# Patient Record
Sex: Male | Born: 1960 | Race: White | Hispanic: No | Marital: Married | Smoking: Former smoker
Health system: Southern US, Community
[De-identification: ages and names within clinical notes are randomized; demographics above are authoritative.]

## PROBLEM LIST (undated history)

## (undated) DIAGNOSIS — I2510 Atherosclerotic heart disease of native coronary artery without angina pectoris: Secondary | ICD-10-CM

## (undated) DIAGNOSIS — E7800 Pure hypercholesterolemia, unspecified: Secondary | ICD-10-CM

## (undated) DIAGNOSIS — Z8719 Personal history of other diseases of the digestive system: Secondary | ICD-10-CM

## (undated) DIAGNOSIS — K219 Gastro-esophageal reflux disease without esophagitis: Secondary | ICD-10-CM

## (undated) DIAGNOSIS — M4316 Spondylolisthesis, lumbar region: Secondary | ICD-10-CM

## (undated) DIAGNOSIS — R519 Headache, unspecified: Secondary | ICD-10-CM

## (undated) DIAGNOSIS — R51 Headache: Secondary | ICD-10-CM

## (undated) DIAGNOSIS — M1990 Unspecified osteoarthritis, unspecified site: Secondary | ICD-10-CM

## (undated) DIAGNOSIS — I10 Essential (primary) hypertension: Secondary | ICD-10-CM

## (undated) HISTORY — DX: Atherosclerotic heart disease of native coronary artery without angina pectoris: I25.10

## (undated) HISTORY — PX: BACK SURGERY: SHX140

## (undated) HISTORY — PX: TONSILLECTOMY: SUR1361

## (undated) HISTORY — PX: NECK SURGERY: SHX720

## (undated) HISTORY — PX: EYE SURGERY: SHX253

---

## 1986-05-08 HISTORY — PX: HEMORROIDECTOMY: SUR656

## 1996-05-08 HISTORY — PX: HERNIA REPAIR: SHX51

## 2010-03-02 ENCOUNTER — Encounter: Admission: RE | Admit: 2010-03-02 | Discharge: 2010-03-02 | Payer: Self-pay | Admitting: Neurosurgery

## 2015-08-15 ENCOUNTER — Encounter (HOSPITAL_COMMUNITY): Payer: Self-pay | Admitting: Family Medicine

## 2015-08-15 ENCOUNTER — Emergency Department (HOSPITAL_COMMUNITY)
Admission: EM | Admit: 2015-08-15 | Discharge: 2015-08-16 | Disposition: A | Discharge status: Home / Self Care | Payer: Managed Care, Other (non HMO) | Attending: Emergency Medicine | Admitting: Emergency Medicine

## 2015-08-15 DIAGNOSIS — F4323 Adjustment disorder with mixed anxiety and depressed mood: Secondary | ICD-10-CM | POA: Diagnosis not present

## 2015-08-15 DIAGNOSIS — G43109 Migraine with aura, not intractable, without status migrainosus: Secondary | ICD-10-CM

## 2015-08-15 DIAGNOSIS — Z79899 Other long term (current) drug therapy: Secondary | ICD-10-CM | POA: Diagnosis not present

## 2015-08-15 DIAGNOSIS — Z7951 Long term (current) use of inhaled steroids: Secondary | ICD-10-CM | POA: Insufficient documentation

## 2015-08-15 DIAGNOSIS — I10 Essential (primary) hypertension: Secondary | ICD-10-CM | POA: Insufficient documentation

## 2015-08-15 DIAGNOSIS — F1310 Sedative, hypnotic or anxiolytic abuse, uncomplicated: Secondary | ICD-10-CM | POA: Diagnosis not present

## 2015-08-15 DIAGNOSIS — G43809 Other migraine, not intractable, without status migrainosus: Secondary | ICD-10-CM | POA: Diagnosis not present

## 2015-08-15 DIAGNOSIS — H748X2 Other specified disorders of left middle ear and mastoid: Secondary | ICD-10-CM | POA: Insufficient documentation

## 2015-08-15 DIAGNOSIS — E7800 Pure hypercholesterolemia, unspecified: Secondary | ICD-10-CM | POA: Diagnosis not present

## 2015-08-15 DIAGNOSIS — F322 Major depressive disorder, single episode, severe without psychotic features: Secondary | ICD-10-CM | POA: Diagnosis present

## 2015-08-15 DIAGNOSIS — R45851 Suicidal ideations: Secondary | ICD-10-CM | POA: Diagnosis present

## 2015-08-15 DIAGNOSIS — F329 Major depressive disorder, single episode, unspecified: Secondary | ICD-10-CM | POA: Insufficient documentation

## 2015-08-15 DIAGNOSIS — F32A Depression, unspecified: Secondary | ICD-10-CM

## 2015-08-15 HISTORY — DX: Pure hypercholesterolemia, unspecified: E78.00

## 2015-08-15 HISTORY — DX: Essential (primary) hypertension: I10

## 2015-08-15 LAB — COMPREHENSIVE METABOLIC PANEL
ALBUMIN: 4.1 g/dL (ref 3.5–5.0)
ALK PHOS: 63 U/L (ref 38–126)
ALT: 28 U/L (ref 17–63)
ANION GAP: 11 (ref 5–15)
AST: 26 U/L (ref 15–41)
BILIRUBIN TOTAL: 0.9 mg/dL (ref 0.3–1.2)
BUN: 20 mg/dL (ref 6–20)
CALCIUM: 9.7 mg/dL (ref 8.9–10.3)
CO2: 22 mmol/L (ref 22–32)
Chloride: 111 mmol/L (ref 101–111)
Creatinine, Ser: 0.98 mg/dL (ref 0.61–1.24)
GLUCOSE: 113 mg/dL — AB (ref 65–99)
POTASSIUM: 4.2 mmol/L (ref 3.5–5.1)
Sodium: 144 mmol/L (ref 135–145)
TOTAL PROTEIN: 6.8 g/dL (ref 6.5–8.1)

## 2015-08-15 LAB — CBC
HCT: 51.5 % (ref 39.0–52.0)
Hemoglobin: 18.5 g/dL — ABNORMAL HIGH (ref 13.0–17.0)
MCH: 30.2 pg (ref 26.0–34.0)
MCHC: 35.9 g/dL (ref 30.0–36.0)
MCV: 84.2 fL (ref 78.0–100.0)
Platelets: 201 10*3/uL (ref 150–400)
RBC: 6.12 MIL/uL — ABNORMAL HIGH (ref 4.22–5.81)
RDW: 13.4 % (ref 11.5–15.5)
WBC: 8 10*3/uL (ref 4.0–10.5)

## 2015-08-15 LAB — RAPID URINE DRUG SCREEN, HOSP PERFORMED
AMPHETAMINES: NOT DETECTED
BENZODIAZEPINES: POSITIVE — AB
Barbiturates: NOT DETECTED
COCAINE: NOT DETECTED
OPIATES: NOT DETECTED
TETRAHYDROCANNABINOL: NOT DETECTED

## 2015-08-15 LAB — ETHANOL

## 2015-08-15 LAB — ACETAMINOPHEN LEVEL

## 2015-08-15 LAB — SALICYLATE LEVEL

## 2015-08-15 MED ORDER — ACETAMINOPHEN 325 MG PO TABS: 650.0000 mg | ORAL_TABLET | ORAL | Status: DC | PRN

## 2015-08-15 MED ORDER — KETOROLAC TROMETHAMINE 15 MG/ML IJ SOLN
15.0000 mg | Freq: Once | INTRAMUSCULAR | Status: AC
  Administered 2015-08-15: 15 mg via INTRAVENOUS
  Filled 2015-08-15: qty 1

## 2015-08-15 MED ORDER — LISINOPRIL-HYDROCHLOROTHIAZIDE 20-25 MG PO TABS: 1.0000 | ORAL_TABLET | Freq: Every day | ORAL | Status: DC

## 2015-08-15 MED ORDER — LISINOPRIL 20 MG PO TABS
20.0000 mg | ORAL_TABLET | Freq: Every day | ORAL | Status: DC
  Administered 2015-08-16: 20 mg via ORAL
  Filled 2015-08-15: qty 1

## 2015-08-15 MED ORDER — PSYLLIUM 95 % PO PACK: 1.0000 | PACK | Freq: Every day | ORAL | Status: DC

## 2015-08-15 MED ORDER — FLUTICASONE PROPIONATE 50 MCG/ACT NA SUSP
1.0000 | Freq: Every day | NASAL | Status: DC
  Administered 2015-08-16: 1 via NASAL
  Filled 2015-08-15: qty 16

## 2015-08-15 MED ORDER — DEXAMETHASONE SODIUM PHOSPHATE 10 MG/ML IJ SOLN
10.0000 mg | Freq: Once | INTRAMUSCULAR | Status: AC
  Administered 2015-08-15: 10 mg via INTRAVENOUS
  Filled 2015-08-15: qty 1

## 2015-08-15 MED ORDER — DIVALPROEX SODIUM 250 MG PO DR TAB
250.0000 mg | DELAYED_RELEASE_TABLET | Freq: Two times a day (BID) | ORAL | Status: DC
  Administered 2015-08-15 – 2015-08-16 (×2): 250 mg via ORAL
  Filled 2015-08-15 (×2): qty 1

## 2015-08-15 MED ORDER — IBUPROFEN 400 MG PO TABS
600.0000 mg | ORAL_TABLET | Freq: Three times a day (TID) | ORAL | Status: DC | PRN
  Administered 2015-08-16: 600 mg via ORAL
  Filled 2015-08-15: qty 1

## 2015-08-15 MED ORDER — PSYLLIUM 95 % PO PACK
1.0000 | PACK | Freq: Every day | ORAL | Status: DC
  Administered 2015-08-15: 1 via ORAL
  Filled 2015-08-15 (×2): qty 1

## 2015-08-15 MED ORDER — HYDROCHLOROTHIAZIDE 25 MG PO TABS
25.0000 mg | ORAL_TABLET | Freq: Every day | ORAL | Status: DC
  Administered 2015-08-16: 25 mg via ORAL
  Filled 2015-08-15: qty 1

## 2015-08-15 MED ORDER — PROMETHAZINE HCL 25 MG PO TABS: 12.5000 mg | ORAL_TABLET | Freq: Every day | ORAL | Status: DC | PRN

## 2015-08-15 MED ORDER — MECLIZINE HCL 25 MG PO TABS: 25.0000 mg | ORAL_TABLET | Freq: Three times a day (TID) | ORAL | Status: DC | PRN

## 2015-08-15 MED ORDER — ZOLPIDEM TARTRATE 5 MG PO TABS
5.0000 mg | ORAL_TABLET | Freq: Every day | ORAL | Status: DC
  Administered 2015-08-15: 5 mg via ORAL
  Filled 2015-08-15: qty 1

## 2015-08-15 MED ORDER — ATORVASTATIN CALCIUM 40 MG PO TABS
40.0000 mg | ORAL_TABLET | Freq: Every morning | ORAL | Status: DC
  Administered 2015-08-16: 40 mg via ORAL
  Filled 2015-08-15: qty 1

## 2015-08-15 MED ORDER — DIPHENHYDRAMINE HCL 50 MG/ML IJ SOLN
25.0000 mg | Freq: Once | INTRAMUSCULAR | Status: AC
  Administered 2015-08-15: 25 mg via INTRAVENOUS
  Filled 2015-08-15: qty 1

## 2015-08-15 MED ORDER — METOCLOPRAMIDE HCL 5 MG/ML IJ SOLN
10.0000 mg | Freq: Once | INTRAMUSCULAR | Status: AC
  Administered 2015-08-15: 10 mg via INTRAVENOUS
  Filled 2015-08-15: qty 2

## 2015-08-15 MED ORDER — LORATADINE 10 MG PO TABS
10.0000 mg | ORAL_TABLET | Freq: Every day | ORAL | Status: DC
  Administered 2015-08-16: 10 mg via ORAL
  Filled 2015-08-15: qty 1

## 2015-08-15 MED ORDER — PANTOPRAZOLE SODIUM 40 MG PO TBEC
40.0000 mg | DELAYED_RELEASE_TABLET | Freq: Every day | ORAL | Status: DC
  Administered 2015-08-15 – 2015-08-16 (×2): 40 mg via ORAL
  Filled 2015-08-15 (×2): qty 1

## 2015-08-15 NOTE — ED Notes (Signed)
Security to wand pt  

## 2015-08-15 NOTE — ED Notes (Signed)
Pt here for depression and suicidal thoughts. sts that he started Topamax 5 weeks ago for migraines and vertigo. sts never hx of depression before. sts he feels out of it. sts pain in ears and throat. sts last 2 days he ha shad thoughts and plans of what he would do to commit suicide.

## 2015-08-15 NOTE — ED Provider Notes (Addendum)
CSN: [id number]     Arrival date & time 08/15/15  1444 History   First MD Initiated Contact with Patient 08/15/15 1553     Chief Complaint  Patient presents with  . Depression  . Suicidal     (Consider location/radiation/quality/duration/timing/severity/associated sxs/prior Treatment) HPI Comments: Patient is a 55 year old male who has had ongoing issue since October with vestibular migraines. He has seen multiple specialists and had multiple imaging done and diagnosis with the last month has been a vestibular migraine. He has been taking meclizine, Valium with only moderate improvement. Now he has debilitating periods of nausea, dizziness and having difficulty functioning at work. Patient has seen a neurologist and they started him on Topamax recently to try to help with the migraines however he is not feeling better in 2 days ago he started to feel depressed with frequent episodes of crying and difficulty focusing. Then today he started to have suicidal thoughts of the possibility of shooting himself for putting a hose in his car but then states that it would be too much for his family. Then he thought about driving down the road in doing it to someone else may find him. When asked if he thinks he go through with it he said he didn't know when asked why he is here to get help.  Patient is a 55 y.o. male presenting with depression. The history is provided by the patient.  Depression This is a new problem. The current episode started 2 days ago. The problem occurs constantly. The problem has been gradually worsening. Associated symptoms include headaches. Nothing aggravates the symptoms. Nothing relieves the symptoms.    Past Medical History  Diagnosis Date  . High cholesterol   . Hypertension    Past Surgical History  Procedure Laterality Date  . Tonsillectomy    . Hernia repair     History reviewed. No pertinent family history. Social History  Substance Use Topics  . Smoking status:  Never Smoker   . Smokeless tobacco: None  . Alcohol Use: No    Review of Systems  Neurological: Positive for headaches.  Psychiatric/Behavioral: Positive for depression.  All other systems reviewed and are negative.     Allergies  Review of patient's allergies indicates no known allergies.  Home Medications   Prior to Admission medications   Medication Sig Start Date End Date Taking? Authorizing Provider  atorvastatin (LIPITOR) 40 MG tablet Take 40 mg by mouth every morning. 08/05/15  Yes Historical Provider, MD  cetirizine (ZYRTEC) 10 MG tablet Take 10 mg by mouth every morning.   Yes Historical Provider, MD  Diclofenac Sodium CR 100 MG 24 hr tablet Take 100 mg by mouth daily. 07/22/15  Yes Historical Provider, MD  fluticasone (FLONASE) 50 MCG/ACT nasal spray Place 1 spray into both nostrils daily. 05/19/15 05/18/16 Yes Historical Provider, MD  lisinopril-hydrochlorothiazide (PRINZIDE,ZESTORETIC) 20-25 MG tablet Take 1 tablet by mouth daily. 06/28/15  Yes Historical Provider, MD  meclizine (ANTIVERT) 25 MG tablet Take 25 mg by mouth 3 (three) times daily as needed for dizziness or nausea.  06/28/15  Yes Historical Provider, MD  omeprazole (PRILOSEC) 20 MG capsule Take 20 mg by mouth every morning.   Yes Historical Provider, MD  promethazine (PHENERGAN) 25 MG tablet Take 12.5 mg by mouth daily as needed for nausea or vomiting.  07/09/15  Yes Historical Provider, MD  psyllium (METAMUCIL) 58.6 % powder Take 1 packet by mouth every evening.   Yes Historical Provider, MD  topiramate (TOPAMAX) 25 MG  tablet Take 50 mg by mouth 2 (two) times daily. 07/22/15  Yes Historical Provider, MD  traMADol (ULTRAM) 50 MG tablet Take 25-50 mg by mouth See admin instructions. Takes 1 tab every morning and 1/2 tab in evening as needed for pain 07/29/15  Yes Historical Provider, MD  zolpidem (AMBIEN) 10 MG tablet Take 5 mg by mouth at bedtime. 08/05/15  Yes Historical Provider, MD   BP 123/87 mmHg  Pulse 69   Temp(Src) 98 F (36.7 C) (Oral)  Resp 18  Ht  (1.88 m)  Wt 250 lb (113.399 kg)  BMI 32.08 kg/m2  SpO2 97% Physical Exam  Constitutional: He is oriented to person, place, and time. He appears well-developed and well-nourished. No distress.  HENT:  Head: Normocephalic and atraumatic.  Right Ear: Tympanic membrane normal.  Left Ear: A middle ear effusion is present.  Mouth/Throat: Oropharynx is clear and moist.  Eyes: Conjunctivae and EOM are normal. Pupils are equal, round, and reactive to light.  Neck: Normal range of motion. Neck supple.  Cardiovascular: Normal rate, regular rhythm and intact distal pulses.   No murmur heard. Pulmonary/Chest: Effort normal and breath sounds normal. No respiratory distress. He has no wheezes. He has no rales.  Abdominal: Soft. He exhibits no distension. There is no tenderness. There is no rebound and no guarding.  Musculoskeletal: Normal range of motion. He exhibits no edema or tenderness.  Neurological: He is alert and oriented to person, place, and time.  Skin: Skin is warm and dry. No rash noted. No erythema.  Psychiatric: His behavior is normal. He exhibits a depressed mood. He expresses suicidal ideation.  Nursing note and vitals reviewed.   ED Course  Procedures (including critical care time) Labs Review Labs Reviewed  COMPREHENSIVE METABOLIC PANEL - Abnormal; Notable for the following:    Glucose, Bld 113 (*)    All other components within normal limits  ACETAMINOPHEN LEVEL - Abnormal; Notable for the following:    Acetaminophen (Tylenol), Serum <10 (*)    All other components within normal limits  CBC - Abnormal; Notable for the following:    RBC 6.12 (*)    Hemoglobin 18.5 (*)    All other components within normal limits  URINE RAPID DRUG SCREEN, HOSP PERFORMED - Abnormal; Notable for the following:    Benzodiazepines POSITIVE (*)    All other components within normal limits  ETHANOL  SALICYLATE LEVEL    Imaging Review No  results found. I have personally reviewed and evaluated these images and lab results as part of my medical decision-making.   EKG Interpretation None      MDM   Final diagnoses:  Depression  Suicidal thoughts  Vestibular migraine    Patient is a 55 year old male with a history of depression in his teen years who has not been treated for depression in his adult life presenting today with symptoms of depression and suicidal thoughts which are most likely all stemming from a chronic medical condition and is having difficulty being treated which causes debilitating dizziness, nausea and headaches.  Patient has had a large workup done at [facility] with normal MRI recently an abnormal vestibular test. He recently saw a neurologist to started him on Topamax but doesn't feel that his symptoms are improving. Discussed symptoms with our neurologist here who states it would be okay to stop the Topamax abruptly and try Depakote 250 mg twice a day. Also had TTS evaluate the patient and given his lethal suicidal thoughts and inability  to contract for safety they will look for placement. Patient was started back on his home meds and started on Depakote.    Gwyneth SproutWhitney Acea Yagi, MD 08/16/15 [id number]  Gwyneth SproutWhitney Korin Setzler, MD 08/16/15 0005

## 2015-08-15 NOTE — BH Assessment (Addendum)
Tele Assessment Note   Daniel Villanueva is a Caucasian 55 y.o. male [facility] complaining of suicidal ideation with plan and other depressive symptoms.  Pt reported as follows:  That he has experienced suicidal ideation and depressive symptoms "on and off" throughout his life (most markedly in his teen years); he also reported that over the last two days, he has experienced a significant amount of suicidal ideation with plan (use gun to shoot self, poison self with carbon monoxide).  He also endorsed fatigue, and he exhibited some irritability.  In addition to these mental concerns, Pt reported that he has been experiencing increasingly severe migraines recently and was recently prescribed Topamax to treat.  Pt denied any substance use concerns.  When asked about his suicidal ideation, Pt was slow to respond, and he stated flatly that he is not sure if he would be safe.  "I think so.  But I'm here for a reason.  I need help."  Pt indicated that he has considered two means of committing suicide.  Per report, he decided against shooting himself in the head because it would be "too messy" for his family.    Pt was accompanied by his daughter.  He lives with his wife and one daughter (two others live outside the home).  He works in transportation as a Agricultural consultantdistrict manager.  During assessment, Pt had poor eye contact -- he rolled on the bed, looked away, and appeared restless or distressed.  He was dressed in scrubs and appeared well-groomed.  Pt's speech was slow, but otherwise normal in rhythm and volume.  Mood was empty and affect was flat (and occasionally irritable).  Pt endorsed suicidal ideation with plan and other symptoms described above.  He denied homicidal ideation and auditory/visual hallucinations.  Pt denied self-injury.  Thought processes were within normal limits and thought content was depressive and suicidal.  Memory and concentration were intact.  Insight, judgment, and  impulsivity were fair to poor.  Consulted with attending physician and with Fransisca KaufmannLaura Davis, NP, who determined that Pt meets inpatient criteria.  Diagnosis: Major Depressive Disorder, Recurrent, Severe  Past Medical History:  Past Medical History  Diagnosis Date  . High cholesterol   . Hypertension     Past Surgical History  Procedure Laterality Date  . Tonsillectomy    . Hernia repair      Family History: History reviewed. No pertinent family history.  Social History:  reports that he has never smoked. He does not have any smokeless tobacco history on file. He reports that he does not drink alcohol or use illicit drugs.  Additional Social History:  Alcohol / Drug Use Pain Medications: See PTA Prescriptions: See PTA Over the Counter: See PTA History of alcohol / drug use?: No history of alcohol / drug abuse  CIWA: CIWA-Ar BP: 118/84 mmHg Pulse Rate: 94 COWS:    PATIENT STRENGTHS: (choose at least two) Ability for insight Average or above average intelligence Capable of independent living  Allergies: No Known Allergies  Home Medications:  (Not in a hospital admission)  OB/GYN Status:  No LMP for male patient.  General Assessment Data [facility] T[facility] Assessment: In system Is this a Tele or Face-to-Face Assessment?: Tele Assessment Is this an Initial Assessment or a Re-assessment for this encounter?: Initial Assessment Marital status: Married Is patient pregnant?: No Pregnancy Status: No Living Arrangements: Spouse/significant other, Children (Wife, daughter) Can pt return to current living arrangement?: Yes Admission Status: Voluntary  Is patient capable of signing voluntary admission?: Yes Referral Source: MD Insurance type: Self-pay  Medical Screening Exam [facility] Walk-in ONLY) Medical Exam completed: Yes  Crisis Care Plan Living Arrangements: Spouse/significant other, Children (Wife, daughter)  Education Status Is patient currently in  school?: No  Risk to self with the past 6 months Suicidal Ideation: Yes-Currently Present Has patient been a risk to self within the past 6 months prior to admission? : No Suicidal Intent: Yes-Currently Present Has patient had any suicidal intent within the past 6 months prior to admission? : No Is patient at risk for suicide?: Yes Suicidal Plan?: Yes-Currently Present Has patient had any suicidal plan within the past 6 months prior to admission? : Yes Specify Current Suicidal Plan: Gun; carbon monoxide poisoning Access to Means: Yes Specify Access to Suicidal Means: Owns firearms What has been your use of drugs/alcohol within the last 12 months?: NA Previous Attempts/Gestures: No Family Suicide History: Unknown Recent stressful life event(s): Recent negative physical changes (Migraines; now on Topomax) Persecutory voices/beliefs?: No Depression: Yes Depression Symptoms: Despondent, Fatigue Substance abuse history and/or treatment for substance abuse?: No Suicide prevention information given to non-admitted patients: Not applicable  Risk to Others within the past 6 months Homicidal Ideation: No Does patient have any lifetime risk of violence toward others beyond the six months prior to admission? : No Thoughts of Harm to Others: No Current Homicidal Intent: No Current Homicidal Plan: No Access to Homicidal Means: No History of harm to others?: No Assessment of Violence: None Noted Does patient have access to weapons?: Yes (Comment) Criminal Charges Pending?: No Does patient have a court date: No Is patient on probation?: No  Psychosis Hallucinations: None noted Delusions: None noted  Mental Status Report Appearance/Hygiene: In scrubs Eye Contact: Poor Motor Activity: Restlessness Speech: Unremarkable, Logical/coherent Level of Consciousness: Quiet/awake Mood: Empty Affect: Sad Anxiety Level: None Thought Processes: Coherent, Relevant Judgement:  Impaired Orientation: Person, Place, Time, Situation Obsessive Compulsive Thoughts/Behaviors: None  Cognitive Functioning Concentration: Fair Memory: Recent Intact, Remote Intact IQ: Average Insight: Poor Impulse Control: Poor Appetite: Fair Sleep: No Change Vegetative Symptoms: None  ADLScreening [facility] Assessment Services) Patient's cognitive ability adequate to safely complete daily activities?: Yes Patient able to express need for assistance with ADLs?: Yes Independently performs ADLs?: Yes (appropriate for developmental age)  Prior Inpatient Therapy Prior Inpatient Therapy: No  Prior Outpatient Therapy Prior Outpatient Therapy: Yes Prior Therapy Dates: When I was a teenager Reason for Treatment: Depression, SI Does patient have an ACCT team?: No Does patient have Intensive In-House Services?  : No Does patient have [facility] services? : No Does patient have P4CC services?: No  ADL Screening (condition at time of admission) Patient's cognitive ability adequate to safely complete daily activities?: Yes Is the patient deaf or have difficulty hearing?: No Does the patient have difficulty seeing, even when wearing glasses/contacts?: No Does the patient have difficulty concentrating, remembering, or making decisions?: No Patient able to express need for assistance with ADLs?: Yes Does the patient have difficulty dressing or bathing?: No Independently performs ADLs?: Yes (appropriate for developmental age) Does the patient have difficulty walking or climbing stairs?: No Weakness of Legs: None Weakness of Arms/Hands: None       Abuse/Neglect Assessment (Assessment to be complete while patient is alone) Physical Abuse: Denies Verbal Abuse: Denies Sexual Abuse: Denies Exploitation of patient/patient's resources: Denies Self-Neglect: Denies Values / Beliefs Cultural Requests During Hospitalization: None Spiritual Requests During Hospitalization: None Consults Spiritual  Care Consult Needed: No Social  Work Librarian, academic Needed: No Merchant navy officer (For Healthcare) Does patient have an advance directive?: No Would patient like information on creating an advanced directive?: No - patient declined information    Additional Information 1:1 In Past 12 Months?: No CIRT Risk: No Elopement Risk: No Does patient have medical clearance?: Yes     Disposition:  Disposition Initial Assessment Completed for this Encounter: Yes Disposition of Patient: Inpatient treatment program Type of inpatient treatment program: Adult (Per L. Earlene Plater, NP, Pt meets inpatient criteria)  Earline Mayotte 08/15/2015 6:00 PM

## 2015-08-15 NOTE — ED Notes (Signed)
Belongings given to wife to take home.

## 2015-08-16 ENCOUNTER — Inpatient Hospital Stay (HOSPITAL_COMMUNITY)
Admission: AD | Admit: 2015-08-16 | Payer: Managed Care, Other (non HMO) | Source: Intra-hospital | Admitting: Psychiatry

## 2015-08-16 DIAGNOSIS — F4323 Adjustment disorder with mixed anxiety and depressed mood: Secondary | ICD-10-CM

## 2015-08-16 DIAGNOSIS — F322 Major depressive disorder, single episode, severe without psychotic features: Secondary | ICD-10-CM | POA: Diagnosis not present

## 2015-08-16 MED ORDER — TRAMADOL HCL 50 MG PO TABS
25.0000 mg | ORAL_TABLET | ORAL | Status: DC
  Administered 2015-08-16: 50 mg via ORAL
  Filled 2015-08-16: qty 1

## 2015-08-16 MED ORDER — DIVALPROEX SODIUM 250 MG PO DR TAB: 250.0000 mg | DELAYED_RELEASE_TABLET | Freq: Two times a day (BID) | ORAL | Status: DC

## 2015-08-16 NOTE — ED Notes (Signed)
Await d/c instructions from doc. Pt has called wife to notify her

## 2015-08-16 NOTE — ED Notes (Signed)
Patient was given a snack and drink, a regular diet was ordered for lunch.

## 2015-08-16 NOTE — ED Notes (Signed)
Per Dr Conchita ParisJanalagada, pt to be dc home with out pt resources

## 2015-08-16 NOTE — ED Notes (Signed)
Dr Conchita ParisJanalagada to bedside

## 2015-08-16 NOTE — ED Notes (Addendum)
Pt asking how much longer until he is discharged, wife lives 40 minutes away.  Dr. Ranae PalmsYelverton notified.

## 2015-08-16 NOTE — Progress Notes (Signed)
Patient was seen by psychiatry. Per Dr. Elsie SaasJonnalagadda, Patient to be d/c'ed home with outpatient mental health resources. CSW provided Patient's RN with outpatient mental health resources. No further CSW needs identified at this time. CSW signing off. Please contact if new need(s) arise.    Lance MussAshley Gardner,MSW, LCSW [facility] ED/34M Clinical Social Worker [phone]

## 2015-08-16 NOTE — ED Notes (Signed)
Pt given diet coke. 

## 2015-08-16 NOTE — Discharge Instructions (Signed)
Suicidal Feelings: How to Help Yourself °Suicide is the taking of one's own life. If you feel as though life is getting too tough to handle and are thinking about suicide, get help right away. To get help: °· Call your local emergency services (911 in the [place] °· Call a suicide hotline to speak with a trained counselor who understands how you are feeling. The following is a list of suicide hotlines in the [place]. For a list of hotlines in [place], visit www.suicide.org/hotlines/international/[place]-suicide-hotlines.html. °¨  1-800-273-TALK (1-800-273-8255). °¨  1-800-SUICIDE (1-800-784-2433). °¨  1-888-628-9454. This is a hotline for Spanish speakers. °¨  1-800-799-4TTY (1-800-799-4889). This is a hotline for TTY users. °¨  1-866-4-U-TREVOR (1-866-488-7386). This is a hotline for lesbian, gay, bisexual, transgender, or questioning youth. °· Contact a crisis center or a local suicide prevention center. To find a crisis center or suicide prevention center: °¨ Call your local hospital, clinic, community service organization, mental health center, social service provider, or health department. Ask for assistance in connecting to a crisis center. °¨ Visit www.suicidepreventionlifeline.org/getinvolved/locator for a list of crisis centers in the [place], or visit www.suicideprevention.[place]/thinking-about-suicide/find-a-crisis-centre for a list of centers in [place]. °· Visit the following websites: °¨  National Suicide Prevention Lifeline: www.suicidepreventionlifeline.org °¨  Hopeline: www.hopeline.com °¨  American Foundation for Suicide Prevention: www.afsp.org °¨  The Trevor Project (for lesbian, gay, bisexual, transgender, or questioning youth): www.thetrevorproject.org °HOW CAN I HELP MYSELF FEEL BETTER? °· Promise yourself that you will not do anything drastic when you have suicidal feelings. Remember, there is hope. Many people have gotten through suicidal thoughts and feelings, and you will, too. You may  have gotten through them before, and this proves that you can get through them again. °· Let family, friends, teachers, or counselors know how you are feeling. Try not to isolate yourself from those who care about you. Remember, they will want to help you. Talk with someone every day, even if you do not feel sociable. Face-to-face conversation is best. °· Call a mental health professional and see one regularly. °· Visit your primary health care provider every year. °· Eat a well-balanced diet, and space your meals so you eat regularly. °· Get plenty of rest. °· Avoid alcohol and drugs, and remove them from your home. They will only make you feel worse. °· If you are thinking of taking a lot of medicine, give your medicine to someone who can give it to you one day at a time. If you are on antidepressants and are concerned you will overdose, let your health care provider know so he or she can give you safer medicines. Ask your mental health professional about the possible side effects of any medicines you are taking. °· Remove weapons, poisons, knives, and anything else that could harm you from your home. °· Try to stick to routines. Follow a schedule every day. Put self-care on your schedule. °· Make a list of realistic goals, and cross them off when you achieve them. Accomplishments give a sense of worth. °· Wait until you are feeling better before doing the things you find difficult or unpleasant. °· Exercise if you are able. You will feel better if you exercise for even a half hour each day. °· Go out in the sun or into nature. This will help you recover from depression faster. If you have a favorite place to walk, go there. °· Do the things that have always given you pleasure. Play your favorite music, read a good book, paint a picture, play your favorite instrument, or do anything   else that takes your mind off your depression if it is safe to do. °· Keep your living space well lit. °· When you are feeling well,  write yourself a letter about tips and support that you can read when you are not feeling well. °· Remember that life's difficulties can be sorted out with help. Conditions can be treated. You can work on thoughts and strategies that serve you well. °  °This information is not intended to replace advice given to you by your health care provider. Make sure you discuss any questions you have with your health care provider. °  °Document Released: 10/29/2002 Document Revised: 05/15/2014 Document Reviewed: 08/19/2013 °Elsevier Interactive Patient Education ©2016 [employer] ° °

## 2015-08-16 NOTE — Consult Note (Signed)
[facility] Face-to-Face Psychiatry Consult   Reason for Consult:  depression with suicide ideation Referring Physician:  EDP Patient Identification: Daniel Villanueva MRN:  [id number] Principal Diagnosis: MDD (major depressive disorder), single episode, severe , no psychosis ([place]) Diagnosis:  There are no active problems to display for this patient.   Total Time spent with patient: 1 hour  Subjective:   Daniel Villanueva is a 55 y.o. male patient admitted with depression with suicide ideation.  HPI:  Daniel Villanueva is a Caucasian 54 y.o. male seen, chart reviewed for this face-to-face psychiatric consultation and evaluation at [facility] for increased symptoms of depression and complaining of suicidal ideation with plan x 2 days.Patient stated that he has been suffering with migraine headaches, vertigo secondary to inner ear problems and has multiple visits to primary care physician, neurologist and ear specialist at [facility] and had done extensive workup but continued to suffer with vertigo, nausea, migraine headaches and frequently worried about falling down and unable to do his work. Reportedly he works for a [employer] and he has a travel has a part of the job. Patient denies current symptoms of depression, suicidal/homicidal ideation, intention or plans and contract for safety. Patient is requesting outpatient counseling services and medication management and medically stable. Patient blames all his recent emotional problems including depression because of his recent new medication Topamax for migraine headaches. Patient denied any history of substance abuse or alcohol family history of mental illness and substance abuse. Patient denied family history of suicidal attempts. Patient states that the family wife and has two children. Patient has been working and has caring family. Patient feels he is doing much better since came to the emergency department as his Topamax to stop and  given appropriate medication to control his symptoms of dizziness, vertigo and migraine headache. Patient satisfied with his current medication management and willing to follow up with outpatient medication management. He works in transportation as a Chartered certified accountant.  Past Psychiatric History: Patient has no previous history of acute psychiatric hospitalization.    Risk to Self: Suicidal Ideation: Yes-Currently Present Suicidal Intent: Yes-Currently Present Is patient at risk for suicide?: Yes Suicidal Plan?: Yes-Currently Present Specify Current Suicidal Plan: Gun; carbon monoxide poisoning Access to Means: Yes Specify Access to Suicidal Means: Owns firearms What has been your use of drugs/alcohol within the last 12 months?: NA Risk to Others: Homicidal Ideation: No Thoughts of Harm to Others: No Current Homicidal Intent: No Current Homicidal Plan: No Access to Homicidal Means: No History of harm to others?: No Assessment of Violence: None Noted Does patient have access to weapons?: Yes (Comment) Criminal Charges Pending?: No Does patient have a court date: No Prior Inpatient Therapy: Prior Inpatient Therapy: No Prior Outpatient Therapy: Prior Outpatient Therapy: Yes Prior Therapy Dates: When I was a teenager Reason for Treatment: Depression, SI Does patient have an ACCT team?: No Does patient have Intensive In-House Services?  : No Does patient have [facility] services? : No Does patient have P4CC services?: No  Past Medical History:  Past Medical History  Diagnosis Date  . High cholesterol   . Hypertension     Past Surgical History  Procedure Laterality Date  . Tonsillectomy    . Hernia repair     Family History: History reviewed. No pertinent family history. Family Psychiatric  History: None reported Social History:  History  Alcohol Use No     History  Drug Use No    Social History  Social History  . Marital Status: Married    Spouse Name: N/A  . Number of  Children: N/A  . Years of Education: N/A   Social History Main Topics  . Smoking status: Never Smoker   . Smokeless tobacco: None  . Alcohol Use: No  . Drug Use: No  . Sexual Activity: Not Asked   Other Topics Concern  . None   Social History Narrative  . None   Additional Social History:    Allergies:  No Known Allergies  Labs:  Results for orders placed or performed during the hospital encounter of 08/15/15 (from the past 48 hour(s))  Comprehensive metabolic panel     Status: Abnormal   Collection Time: 08/15/15  3:01 PM  Result Value Ref Range   Sodium 144 135 - 145 mmol/L   Potassium 4.2 3.5 - 5.1 mmol/L   Chloride 111 101 - 111 mmol/L   CO2 22 22 - 32 mmol/L   Glucose, Bld 113 (H) 65 - 99 mg/dL   BUN 20 6 - 20 mg/dL   Creatinine, Ser 0.98 0.61 - 1.24 mg/dL   Calcium 9.7 8.9 - 10.3 mg/dL   Total Protein 6.8 6.5 - 8.1 g/dL   Albumin 4.1 3.5 - 5.0 g/dL   AST 26 15 - 41 U/L   ALT 28 17 - 63 U/L   Alkaline Phosphatase 63 38 - 126 U/L   Total Bilirubin 0.9 0.3 - 1.2 mg/dL   GFR calc non Af Amer >60 >60 mL/min   GFR calc Af Amer >60 >60 mL/min    Comment: (NOTE) The eGFR has been calculated using the CKD EPI equation. This calculation has not been validated in all clinical situations. eGFR's persistently <60 mL/min signify possible Chronic Kidney Disease.    Anion gap 11 5 - 15  Ethanol (ETOH)     Status: None   Collection Time: 08/15/15  3:01 PM  Result Value Ref Range   Alcohol, Ethyl (B) <5 <5 mg/dL    Comment:        LOWEST DETECTABLE LIMIT FOR SERUM ALCOHOL IS 5 mg/dL FOR MEDICAL PURPOSES ONLY   Salicylate level     Status: None   Collection Time: 08/15/15  3:01 PM  Result Value Ref Range   Salicylate Lvl <9.3 2.8 - 30.0 mg/dL  Acetaminophen level     Status: Abnormal   Collection Time: 08/15/15  3:01 PM  Result Value Ref Range   Acetaminophen (Tylenol), Serum <10 (L) 10 - 30 ug/mL    Comment:        THERAPEUTIC CONCENTRATIONS VARY SIGNIFICANTLY.  A RANGE OF 10-30 ug/mL MAY BE AN EFFECTIVE CONCENTRATION FOR MANY PATIENTS. HOWEVER, SOME ARE BEST TREATED AT CONCENTRATIONS OUTSIDE THIS RANGE. ACETAMINOPHEN CONCENTRATIONS >150 ug/mL AT 4 HOURS AFTER INGESTION AND >50 ug/mL AT 12 HOURS AFTER INGESTION ARE OFTEN ASSOCIATED WITH TOXIC REACTIONS.   CBC     Status: Abnormal   Collection Time: 08/15/15  3:01 PM  Result Value Ref Range   WBC 8.0 4.0 - 10.5 K/uL   RBC 6.12 (H) 4.22 - 5.81 MIL/uL   Hemoglobin 18.5 (H) 13.0 - 17.0 g/dL   HCT 51.5 39.0 - 52.0 %   MCV 84.2 78.0 - 100.0 fL   MCH 30.2 26.0 - 34.0 pg   MCHC 35.9 30.0 - 36.0 g/dL   RDW 13.4 11.5 - 15.5 %   Platelets 201 150 - 400 K/uL  Urine rapid drug screen (hosp performed) (Not at [facility])  Status: Abnormal   Collection Time: 08/15/15  3:07 PM  Result Value Ref Range   Opiates NONE DETECTED NONE DETECTED   Cocaine NONE DETECTED NONE DETECTED   Benzodiazepines POSITIVE (A) NONE DETECTED   Amphetamines NONE DETECTED NONE DETECTED   Tetrahydrocannabinol NONE DETECTED NONE DETECTED   Barbiturates NONE DETECTED NONE DETECTED    Comment:        DRUG SCREEN FOR MEDICAL PURPOSES ONLY.  IF CONFIRMATION IS NEEDED FOR ANY PURPOSE, NOTIFY LAB WITHIN 5 DAYS.        LOWEST DETECTABLE LIMITS FOR URINE DRUG SCREEN Drug Class       Cutoff (ng/mL) Amphetamine      1000 Barbiturate      200 Benzodiazepine   478 Tricyclics       295 Opiates          300 Cocaine          300 THC              50     Current Facility-Administered Medications  Medication Dose Route Frequency Provider Last Rate Last Dose  . acetaminophen (TYLENOL) tablet 650 mg  650 mg Oral Q4H PRN Blanchie Dessert, MD      . atorvastatin (LIPITOR) tablet 40 mg  40 mg Oral q morning - 10a Blanchie Dessert, MD   40 mg at 08/16/15 1044  . divalproex (DEPAKOTE) DR tablet 250 mg  250 mg Oral Q12H Blanchie Dessert, MD   250 mg at 08/16/15 1044  . fluticasone (FLONASE) 50 MCG/ACT nasal spray 1 spray  1 spray Each  Nare Daily Blanchie Dessert, MD   1 spray at 08/15/15 2043  . lisinopril (PRINIVIL,ZESTRIL) tablet 20 mg  20 mg Oral Daily Blanchie Dessert, MD   20 mg at 08/16/15 1044   And  . hydrochlorothiazide (HYDRODIURIL) tablet 25 mg  25 mg Oral Daily Blanchie Dessert, MD   25 mg at 08/16/15 1044  . ibuprofen (ADVIL,MOTRIN) tablet 600 mg  600 mg Oral Q8H PRN Blanchie Dessert, MD   600 mg at 08/16/15 0400  . loratadine (CLARITIN) tablet 10 mg  10 mg Oral Daily Blanchie Dessert, MD   10 mg at 08/16/15 1057  . meclizine (ANTIVERT) tablet 25 mg  25 mg Oral TID PRN Blanchie Dessert, MD      . pantoprazole (PROTONIX) EC tablet 40 mg  40 mg Oral Daily Blanchie Dessert, MD   40 mg at 08/16/15 1043  . promethazine (PHENERGAN) tablet 12.5 mg  12.5 mg Oral Daily PRN Blanchie Dessert, MD      . psyllium (HYDROCIL/METAMUCIL) packet 1 packet  1 packet Oral Daily Nat Christen, MD   1 packet at 08/15/15 2134  . traMADol (ULTRAM) tablet 25-50 mg  25-50 mg Oral See admin instructions Julianne Rice, MD   50 mg at 08/16/15 1057  . zolpidem (AMBIEN) tablet 5 mg  5 mg Oral QHS Blanchie Dessert, MD   5 mg at 08/15/15 2134   Current Outpatient Prescriptions  Medication Sig Dispense Refill  . atorvastatin (LIPITOR) 40 MG tablet Take 40 mg by mouth every morning.    . cetirizine (ZYRTEC) 10 MG tablet Take 10 mg by mouth every morning.    . Diclofenac Sodium CR 100 MG 24 hr tablet Take 100 mg by mouth daily.  0  . fluticasone (FLONASE) 50 MCG/ACT nasal spray Place 1 spray into both nostrils daily.    Marland Kitchen lisinopril-hydrochlorothiazide (PRINZIDE,ZESTORETIC) 20-25 MG tablet Take 1 tablet by mouth daily.    Marland Kitchen  meclizine (ANTIVERT) 25 MG tablet Take 25 mg by mouth 3 (three) times daily as needed for dizziness or nausea.   0  . omeprazole (PRILOSEC) 20 MG capsule Take 20 mg by mouth every morning.    . promethazine (PHENERGAN) 25 MG tablet Take 12.5 mg by mouth daily as needed for nausea or vomiting.   0  . psyllium (METAMUCIL) 58.6 %  powder Take 1 packet by mouth every evening.    . topiramate (TOPAMAX) 25 MG tablet Take 50 mg by mouth 2 (two) times daily.  0  . traMADol (ULTRAM) 50 MG tablet Take 25-50 mg by mouth See admin instructions. Takes 1 tab every morning and 1/2 tab in evening as needed for pain    . zolpidem (AMBIEN) 10 MG tablet Take 5 mg by mouth at bedtime.      Musculoskeletal: Strength & Muscle Tone: within normal limits Gait & Station: normal Patient leans: N/A  Psychiatric Specialty Exam: ROS complaining of migraine headaches, vertigo and dizziness which were controlled with current medication regimen. No Fever-chills, No Headache, No changes with Vision or hearing, reports vertigo No problems swallowing food or Liquids, No Chest pain, Cough or Shortness of Breath, No Abdominal pain, No Nausea or Vommitting, Bowel movements are regular, No Blood in stool or Urine, No dysuria, No new skin rashes or bruises, No new joints pains-aches,  No new weakness, tingling, numbness in any extremity, No recent weight gain or loss, No polyuria, polydypsia or polyphagia,   A full 10 point Review of Systems was done, except as stated above, all other Review of Systems were negative.   Blood pressure 158/97, pulse 104, temperature 97.4 F (36.3 C), temperature source Oral, resp. rate 18, height 6' 2"  (1.88 m), weight 113.399 kg (250 lb), SpO2 100 %.Body mass index is 32.08 kg/(m^2).  General Appearance: Casual  Eye Contact::  Good  Speech:  Clear and Coherent  Volume:  Normal  Mood:  Anxious and Depressed  Affect:  Appropriate and Congruent  Thought Process:  Coherent and Goal Directed  Orientation:  Full (Time, Place, and Person)  Thought Content:  WDL  Suicidal Thoughts:  No  Homicidal Thoughts:  No  Memory:  Immediate;   Good Recent;   Good Remote;   Good  Judgement:  Intact  Insight:  Good  Psychomotor Activity:  Decreased  Concentration:  Good  Recall:  Good  Fund of Knowledge:Good   Language: Good  Akathisia:  Negative  Handed:  Right  AIMS (if indicated):     Assets:  Communication Skills Desire for Improvement Financial Resources/Insurance Housing Intimacy Resilience Social Support Talents/Skills Transportation Vocational/Educational  ADL's:  Intact  Cognition: WNL  Sleep:      Treatment Plan Summary: Patient has been suffering with depression and currently no active suicidal ideation, intention or plans. Patient claims his medical problems and recent medication for his depressive episode for 2 days.  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   Patient started feeling better since medication was discontinued and satisfied with the current medication regimen   Patient does not benefit from inpatient psychiatric hospitalization as his main stress is her medical conditions which cannot be addressed in a short psychiatric hospitalization  Recommended to continue his current medications and follow up with outpatient medication management  Discontinue safety sitter as patient contract for safety  Patient will be referred to the outpatient medication management and counseling services  Disposition: Patient does not meet criteria for psychiatric inpatient admission. Supportive therapy provided about ongoing stressors.  Durward Parcel., MD 08/16/2015 11:02 AM

## 2017-04-19 ENCOUNTER — Other Ambulatory Visit: Payer: Self-pay | Admitting: Neurosurgery

## 2017-04-30 NOTE — Pre-Procedure Instructions (Signed)
Cyndi Benderaul C Liebig  04/30/2017      [facility] - [place], [place] - [address] [place] [id number] Phone: [phone] Fax: [phone]    Your procedure is scheduled on Jan 4.  [facility]  Call this number if you have problems the morning of surgery:  [phone]   Remember:  Do not eat food or drink liquids after midnight.  Take these medicines the morning of surgery with A SIP OF WATER Cetirizine (Zyrtec), Divalproex (Depakote), Flonase spray, Omeprazole (Prilosec), topiramate (Topamax), Tramadol (Ultram) if needed, Meclizine (Antivert) if needed  Stop taking aspirin, BC's, Goody's, Herbal medications, Vitamins, Aleve, Diclofenac, Ibuprofen, Advil, Motrin   Do not wear jewelry, make-up or nail polish.  Do not wear lotions, powders, or perfumes, or deodorant.  Do not shave 48 hours prior to surgery.  Men may shave face and neck.  Do not bring valuables to the hospital.  [facility] any belongings or valuables.  Contacts, dentures or bridgework may not be worn into surgery.  Leave your suitcase in the car.  After surgery it may be brought to your room.  For patients admitted to the hospital, discharge time will be determined by your treatment team.  Patients discharged the day of surgery will not be allowed to drive home.   Special instructions:  [place] - Preparing for Surgery  Before surgery, you can play an important role.  Because skin is not sterile, your skin needs to be as free of germs as possible.  You can reduce the number of germs on you skin by washing with CHG (chlorahexidine gluconate) soap before surgery.  CHG is an antiseptic cleaner which kills germs and bonds with the skin to continue killing germs even after washing.  Please DO NOT use if you have an allergy to CHG or antibacterial soaps.  If your skin becomes reddened/irritated stop  using the CHG and inform your nurse when you arrive at Short Stay.  Do not shave (including legs and underarms) for at least 48 hours prior to the first CHG shower.  You may shave your face.  Please follow these instructions carefully:   1.  Shower with CHG Soap the night before surgery and the    morning of Surgery.  2.  If you choose to wash your hair, wash your hair first as usual with your  normal shampoo.  3.  After you shampoo, rinse your hair and body thoroughly to remove the Shampoo.  4.  Use CHG as you would any other liquid soap.  You can apply chg directly  to the skin and wash gently with scrungie or a clean washcloth.  5.  Apply the CHG Soap to your body ONLY FROM THE NECK DOWN.  Do not use on open wounds or open sores.  Avoid contact with your eyes,       ears, mouth and genitals (private parts).  Wash genitals (private parts)  with your normal soap.  6.  Wash thoroughly, paying special attention to the area where your surgery will be performed.  7.  Thoroughly rinse your body with warm water from the neck down.  8.  DO NOT shower/wash with your normal soap after using and rinsing off  the CHG Soap.  9.  Pat yourself dry with a clean towel.            10.  Wear clean  pajamas.            11.  Place clean sheets on your bed the night of your first shower and do not sleep with pets.  Day of Surgery  Do not apply any lotions/deoderants the morning of surgery.  Please wear clean clothes to the hospital/surgery center.     Please read over the following fact sheets that you were given. Pain Booklet, Coughing and Deep Breathing, MRSA Information and Surgical Site Infection Prevention

## 2017-05-02 ENCOUNTER — Other Ambulatory Visit: Payer: Self-pay

## 2017-05-02 ENCOUNTER — Encounter (HOSPITAL_COMMUNITY): Payer: Self-pay

## 2017-05-02 ENCOUNTER — Encounter (HOSPITAL_COMMUNITY)
Admission: RE | Admit: 2017-05-02 | Discharge: 2017-05-02 | Disposition: A | Discharge status: Home / Self Care | Payer: Managed Care, Other (non HMO) | Source: Ambulatory Visit | Attending: Neurosurgery | Admitting: Neurosurgery

## 2017-05-02 DIAGNOSIS — Z01812 Encounter for preprocedural laboratory examination: Secondary | ICD-10-CM | POA: Diagnosis present

## 2017-05-02 HISTORY — DX: Gastro-esophageal reflux disease without esophagitis: K21.9

## 2017-05-02 HISTORY — DX: Headache: R51

## 2017-05-02 HISTORY — DX: Personal history of other diseases of the digestive system: Z87.19

## 2017-05-02 HISTORY — DX: Unspecified osteoarthritis, unspecified site: M19.90

## 2017-05-02 HISTORY — DX: Headache, unspecified: R51.9

## 2017-05-02 LAB — CBC WITH DIFFERENTIAL/PLATELET
BASOS ABS: 0 10*3/uL (ref 0.0–0.1)
Basophils Relative: 1 %
Eosinophils Absolute: 0.2 10*3/uL (ref 0.0–0.7)
Eosinophils Relative: 2 %
HCT: 47.2 % (ref 39.0–52.0)
Hemoglobin: 16.9 g/dL (ref 13.0–17.0)
LYMPHS ABS: 3.1 10*3/uL (ref 0.7–4.0)
Lymphocytes Relative: 43 %
MCH: 30.1 pg (ref 26.0–34.0)
MCHC: 35.8 g/dL (ref 30.0–36.0)
MCV: 84 fL (ref 78.0–100.0)
MONO ABS: 0.5 10*3/uL (ref 0.1–1.0)
Monocytes Relative: 8 %
NEUTROS ABS: 3.3 10*3/uL (ref 1.7–7.7)
Neutrophils Relative %: 46 %
Platelets: 154 10*3/uL (ref 150–400)
RBC: 5.62 MIL/uL (ref 4.22–5.81)
RDW: 12.9 % (ref 11.5–15.5)
WBC: 7.1 10*3/uL (ref 4.0–10.5)

## 2017-05-02 LAB — TYPE AND SCREEN
ABO/RH(D): A POS
Antibody Screen: NEGATIVE

## 2017-05-02 LAB — COMPREHENSIVE METABOLIC PANEL
ALT: 24 U/L (ref 17–63)
ANION GAP: 7 (ref 5–15)
AST: 25 U/L (ref 15–41)
Albumin: 3.8 g/dL (ref 3.5–5.0)
Alkaline Phosphatase: 54 U/L (ref 38–126)
BUN: 8 mg/dL (ref 6–20)
CHLORIDE: 102 mmol/L (ref 101–111)
CO2: 27 mmol/L (ref 22–32)
Calcium: 8.9 mg/dL (ref 8.9–10.3)
Creatinine, Ser: 0.8 mg/dL (ref 0.61–1.24)
GFR calc non Af Amer: >60 mL/min (ref >60)
Glucose, Bld: 113 mg/dL — ABNORMAL HIGH (ref 65–99)
POTASSIUM: 3.8 mmol/L (ref 3.5–5.1)
SODIUM: 136 mmol/L (ref 135–145)
Total Bilirubin: 0.9 mg/dL (ref 0.3–1.2)
Total Protein: 6.3 g/dL — ABNORMAL LOW (ref 6.5–8.1)

## 2017-05-02 LAB — ABO/RH: ABO/RH(D): A POS

## 2017-05-02 LAB — SURGICAL PCR SCREEN
MRSA, PCR: NEGATIVE
STAPHYLOCOCCUS AUREUS: NEGATIVE

## 2017-05-02 NOTE — Progress Notes (Signed)
   05/02/17 1549  OBSTRUCTIVE SLEEP APNEA  Have you ever been diagnosed with sleep apnea through a sleep study? No  Do you snore loudly (loud enough to be heard through closed doors)?  0  Do you often feel tired, fatigued, or sleepy during the daytime (such as falling asleep during driving or talking to someone)? 0  Has anyone observed you stop breathing during your sleep? 0  Do you have, or are you being treated for high blood pressure? 1  BMI more than 35 kg/m2? 1  Age > 50 (1-yes) 1  Neck circumference greater than:Male 16 inches or larger, Male 17inches or larger? 1  Male Gender (Yes=1) 1  Obstructive Sleep Apnea Score 5  Score 5 or greater  Results sent to PCP

## 2017-05-02 NOTE — Pre-Procedure Instructions (Signed)
Cyndi Benderaul C Maya  05/02/2017      [facility] [id number] - [place], [place] - [address] AT [facility] [address] [facility] [phone] Phone: [phone] Fax: [phone]    Your procedure is scheduled on Jan 4.  [facility] at 8:25 A.M.  Call this number if you have problems the morning of surgery:  [phone]   Remember:  Do not eat food or drink liquids after midnight.  Take these medicines the morning of surgery with A SIP OF WATER Divalproex (Depakote), Flonase spray, Omeprazole (Prilosec), Tramadol (Ultram) if needed  Stop taking aspirin, BC's, Goody's, Herbal medications, Vitamins, Aleve, Diclofenac, Ibuprofen, Advil, Motrin   Do not wear jewelry, make-up or nail polish.  Do not wear lotions, powders, or perfumes, or deodorant.             Men may shave face and neck.  Do not bring valuables to the hospital.  [facility] is not responsible for any belongings or valuables.  Contacts, dentures or bridgework may not be worn into surgery.  Leave your suitcase in the car.  After surgery it may be brought to your room.  For patients admitted to the hospital, discharge time will be determined by your treatment team.  Patients discharged the day of surgery will not be allowed to drive home.   Special instructions:  [place] - Preparing for Surgery  Before surgery, you can play an important role.  Because skin is not sterile, your skin needs to be as free of germs as possible.  You can reduce the number of germs on you skin by washing with CHG (chlorahexidine gluconate) soap before surgery.  CHG is an antiseptic cleaner which kills germs and bonds with the skin to continue killing germs even after washing.  Please DO NOT use if you have an allergy to CHG or antibacterial soaps.  If your skin becomes reddened/irritated stop using the CHG and inform your nurse when you arrive at Short Stay.  Do not shave  (including legs and underarms) for at least 48 hours prior to the first CHG shower.  You may shave your face.  Please follow these instructions carefully:   1.  Shower with CHG Soap the night before surgery and the    morning of Surgery.  2.  If you choose to wash your hair, wash your hair first as usual with your  normal shampoo.  3.  After you shampoo, rinse your hair and body thoroughly to remove the Shampoo.  4.  Use CHG as you would any other liquid soap.  You can apply chg directly  to the skin and wash gently with scrungie or a clean washcloth.  5.  Apply the CHG Soap to your body ONLY FROM THE NECK DOWN.  Do not use on open wounds or open sores.  Avoid contact with your eyes,       ears, mouth and genitals (private parts).  Wash genitals (private parts)  with your normal soap.  6.  Wash thoroughly, paying special attention to the area where your surgery will be performed.  7.  Thoroughly rinse your body with warm water from the neck down.  8.  DO NOT shower/wash with your normal soap after using and rinsing off  the CHG Soap.  9.  Pat yourself dry with a clean towel.            10.  Wear clean  pajamas.            11.  Place clean sheets on your bed the night of your first shower and do not sleep with pets.  Day of Surgery  Do not apply any lotions/deoderants the morning of surgery.  Please wear clean clothes to the hospital/surgery center.     Please read over the following fact sheets that you were given. Pain Booklet, Coughing and Deep Breathing, MRSA Information and Surgical Site Infection Prevention

## 2017-05-02 NOTE — Progress Notes (Signed)
Pt. Seeing & being treated for sebaceous cyst on R shoulder back.  Pt. Reports that he will finish his Rx of doxycyline on 12/28.  Pt. Also reports that he had ingrown hair on his chin some time ago & was treated with IV antibiotics & then Bactroban.  Pt. Doesn't know result of the culture of either of these incidences. Pt. Denies cardiac studies in the past, he denies chest concerns at present.

## 2017-05-10 MED ORDER — DEXTROSE 5 % IV SOLN
3.0000 g | INTRAVENOUS | Status: AC
  Administered 2017-05-11: 3 g via INTRAVENOUS
  Filled 2017-05-10: qty 3

## 2017-05-10 MED ORDER — DEXAMETHASONE SODIUM PHOSPHATE 10 MG/ML IJ SOLN
10.0000 mg | INTRAMUSCULAR | Status: AC
  Administered 2017-05-11: 10 mg via INTRAVENOUS
  Filled 2017-05-10: qty 1

## 2017-05-10 NOTE — Anesthesia Preprocedure Evaluation (Addendum)
Anesthesia Evaluation  Patient identified by MRN, date of birth, ID band Patient awake    Reviewed: Allergy & Precautions, NPO status , Patient's Chart, lab work & pertinent test results  History of Anesthesia Complications Negative for: history of anesthetic complications  Airway Mallampati: II  TM Distance: >3 FB Neck ROM: Full    Dental  (+) Dental Advisory Given   Pulmonary Recent URI , former smoker (quit 1989),    breath sounds clear to auscultation       Cardiovascular hypertension, Pt. on medications (-) angina Rhythm:Regular Rate:Normal     Neuro/Psych Depression Chronic back pain    GI/Hepatic Neg liver ROS, GERD  Medicated and Controlled,  Endo/Other  Morbid obesity  Renal/GU negative Renal ROS     Musculoskeletal   Abdominal (+) + obese,   Peds  Hematology negative hematology ROS (+)   Anesthesia Other Findings   Reproductive/Obstetrics                            Anesthesia Physical Anesthesia Plan  ASA: II  Anesthesia Plan: General   Post-op Pain Management:    Induction: Intravenous  PONV Risk Score and Plan: 3 and Ondansetron, Dexamethasone and Scopolamine patch - Pre-op  Airway Management Planned: Oral ETT  Additional Equipment:   Intra-op Plan:   Post-operative Plan: Extubation in OR  Informed Consent: I have reviewed the patients History and Physical, chart, labs and discussed the procedure including the risks, benefits and alternatives for the proposed anesthesia with the patient or authorized representative who has indicated his/her understanding and acceptance.   Dental advisory given  Plan Discussed with: CRNA and Surgeon  Anesthesia Plan Comments: (Plan routine monitors, GETA )        Anesthesia Quick Evaluation

## 2017-05-11 ENCOUNTER — Encounter (HOSPITAL_COMMUNITY)
Admission: RE | Disposition: A | Discharge status: Home / Self Care | Payer: Self-pay | Source: Ambulatory Visit | Attending: Neurosurgery

## 2017-05-11 ENCOUNTER — Inpatient Hospital Stay (HOSPITAL_COMMUNITY): Payer: Managed Care, Other (non HMO)

## 2017-05-11 ENCOUNTER — Inpatient Hospital Stay (HOSPITAL_COMMUNITY): Payer: Managed Care, Other (non HMO) | Admitting: Certified Registered"

## 2017-05-11 ENCOUNTER — Inpatient Hospital Stay (HOSPITAL_COMMUNITY)
Admission: RE | Admit: 2017-05-11 | Discharge: 2017-05-12 | DRG: 455 | Disposition: A | Discharge status: Home / Self Care | Payer: Managed Care, Other (non HMO) | Source: Ambulatory Visit | Attending: Neurosurgery | Admitting: Neurosurgery

## 2017-05-11 ENCOUNTER — Other Ambulatory Visit: Payer: Self-pay

## 2017-05-11 ENCOUNTER — Encounter (HOSPITAL_COMMUNITY): Payer: Self-pay

## 2017-05-11 DIAGNOSIS — Z91048 Other nonmedicinal substance allergy status: Secondary | ICD-10-CM

## 2017-05-11 DIAGNOSIS — Z9841 Cataract extraction status, right eye: Secondary | ICD-10-CM | POA: Diagnosis not present

## 2017-05-11 DIAGNOSIS — M4316 Spondylolisthesis, lumbar region: Secondary | ICD-10-CM | POA: Diagnosis present

## 2017-05-11 DIAGNOSIS — M4696 Unspecified inflammatory spondylopathy, lumbar region: Secondary | ICD-10-CM | POA: Diagnosis present

## 2017-05-11 DIAGNOSIS — Z6835 Body mass index (BMI) 35.0-35.9, adult: Secondary | ICD-10-CM | POA: Diagnosis not present

## 2017-05-11 DIAGNOSIS — Z961 Presence of intraocular lens: Secondary | ICD-10-CM | POA: Diagnosis present

## 2017-05-11 DIAGNOSIS — Z9842 Cataract extraction status, left eye: Secondary | ICD-10-CM

## 2017-05-11 DIAGNOSIS — F329 Major depressive disorder, single episode, unspecified: Secondary | ICD-10-CM | POA: Diagnosis present

## 2017-05-11 DIAGNOSIS — Z9049 Acquired absence of other specified parts of digestive tract: Secondary | ICD-10-CM | POA: Diagnosis not present

## 2017-05-11 DIAGNOSIS — K219 Gastro-esophageal reflux disease without esophagitis: Secondary | ICD-10-CM | POA: Diagnosis present

## 2017-05-11 DIAGNOSIS — Z87891 Personal history of nicotine dependence: Secondary | ICD-10-CM | POA: Diagnosis not present

## 2017-05-11 DIAGNOSIS — Z8709 Personal history of other diseases of the respiratory system: Secondary | ICD-10-CM

## 2017-05-11 DIAGNOSIS — E7800 Pure hypercholesterolemia, unspecified: Secondary | ICD-10-CM | POA: Diagnosis present

## 2017-05-11 DIAGNOSIS — M48061 Spinal stenosis, lumbar region without neurogenic claudication: Secondary | ICD-10-CM | POA: Diagnosis present

## 2017-05-11 DIAGNOSIS — I10 Essential (primary) hypertension: Secondary | ICD-10-CM | POA: Diagnosis present

## 2017-05-11 DIAGNOSIS — M4310 Spondylolisthesis, site unspecified: Secondary | ICD-10-CM | POA: Diagnosis present

## 2017-05-11 HISTORY — DX: Spondylolisthesis, lumbar region: M43.16

## 2017-05-11 SURGERY — POSTERIOR LUMBAR FUSION 1 LEVEL
Anesthesia: General | Site: Back

## 2017-05-11 MED ORDER — 0.9 % SODIUM CHLORIDE (POUR BTL) OPTIME
TOPICAL | Status: DC | PRN
  Administered 2017-05-11: 1000 mL

## 2017-05-11 MED ORDER — SCOPOLAMINE 1 MG/3DAYS TD PT72
MEDICATED_PATCH | TRANSDERMAL | Status: AC
  Filled 2017-05-11: qty 1

## 2017-05-11 MED ORDER — BUPIVACAINE HCL (PF) 0.25 % IJ SOLN
INTRAMUSCULAR | Status: AC
  Filled 2017-05-11: qty 30

## 2017-05-11 MED ORDER — SURGIFOAM 100 EX MISC
CUTANEOUS | Status: DC | PRN
  Administered 2017-05-11: 09:00:00 via TOPICAL

## 2017-05-11 MED ORDER — PANTOPRAZOLE SODIUM 40 MG PO TBEC: 40.0000 mg | DELAYED_RELEASE_TABLET | Freq: Every day | ORAL | Status: DC

## 2017-05-11 MED ORDER — ZOLPIDEM TARTRATE 5 MG PO TABS: 5.0000 mg | ORAL_TABLET | Freq: Every evening | ORAL | Status: DC | PRN

## 2017-05-11 MED ORDER — SUGAMMADEX SODIUM 200 MG/2ML IV SOLN
INTRAVENOUS | Status: DC | PRN
  Administered 2017-05-11: 250 mg via INTRAVENOUS

## 2017-05-11 MED ORDER — SUGAMMADEX SODIUM 200 MG/2ML IV SOLN
INTRAVENOUS | Status: AC
  Filled 2017-05-11: qty 2

## 2017-05-11 MED ORDER — SCOPOLAMINE 1 MG/3DAYS TD PT72
MEDICATED_PATCH | TRANSDERMAL | Status: DC | PRN
  Administered 2017-05-11: 1 via TRANSDERMAL

## 2017-05-11 MED ORDER — LIDOCAINE 2% (20 MG/ML) 5 ML SYRINGE
INTRAMUSCULAR | Status: AC
  Filled 2017-05-11: qty 5

## 2017-05-11 MED ORDER — SODIUM CHLORIDE 0.9% FLUSH: 3.0000 mL | Freq: Two times a day (BID) | INTRAVENOUS | Status: DC

## 2017-05-11 MED ORDER — ONDANSETRON HCL 4 MG/2ML IJ SOLN
INTRAMUSCULAR | Status: AC
  Filled 2017-05-11: qty 2

## 2017-05-11 MED ORDER — BUPIVACAINE HCL (PF) 0.25 % IJ SOLN
INTRAMUSCULAR | Status: DC | PRN
  Administered 2017-05-11: 30 mL

## 2017-05-11 MED ORDER — ROCURONIUM BROMIDE 100 MG/10ML IV SOLN
INTRAVENOUS | Status: DC | PRN
  Administered 2017-05-11: 70 mg via INTRAVENOUS
  Administered 2017-05-11: 30 mg via INTRAVENOUS

## 2017-05-11 MED ORDER — CHLORHEXIDINE GLUCONATE CLOTH 2 % EX PADS: 6.0000 | MEDICATED_PAD | Freq: Once | CUTANEOUS | Status: DC

## 2017-05-11 MED ORDER — TAMSULOSIN HCL 0.4 MG PO CAPS
0.4000 mg | ORAL_CAPSULE | Freq: Every day | ORAL | Status: DC
  Administered 2017-05-11: 0.4 mg via ORAL
  Filled 2017-05-11: qty 1

## 2017-05-11 MED ORDER — BACITRACIN 50000 UNITS IM SOLR
INTRAMUSCULAR | Status: DC | PRN
  Administered 2017-05-11: 09:00:00

## 2017-05-11 MED ORDER — OXYCODONE HCL 5 MG PO TABS
ORAL_TABLET | ORAL | Status: AC
  Filled 2017-05-11: qty 2

## 2017-05-11 MED ORDER — MIDAZOLAM HCL 2 MG/2ML IJ SOLN
INTRAMUSCULAR | Status: AC
  Filled 2017-05-11: qty 2

## 2017-05-11 MED ORDER — ACETAMINOPHEN 325 MG PO TABS: 650.0000 mg | ORAL_TABLET | ORAL | Status: DC | PRN

## 2017-05-11 MED ORDER — VANCOMYCIN HCL 1000 MG IV SOLR
INTRAVENOUS | Status: DC | PRN
  Administered 2017-05-11: 1000 mg

## 2017-05-11 MED ORDER — FENTANYL CITRATE (PF) 250 MCG/5ML IJ SOLN
INTRAMUSCULAR | Status: AC
  Filled 2017-05-11: qty 5

## 2017-05-11 MED ORDER — PSYLLIUM 95 % PO PACK
1.0000 | PACK | Freq: Every day | ORAL | Status: DC
  Filled 2017-05-11 (×2): qty 1

## 2017-05-11 MED ORDER — HYDROMORPHONE HCL 1 MG/ML IJ SOLN
INTRAMUSCULAR | Status: AC
  Administered 2017-05-11: 0.25 mg via INTRAVENOUS
  Filled 2017-05-11: qty 1

## 2017-05-11 MED ORDER — FLEET ENEMA 7-19 GM/118ML RE ENEM: 1.0000 | ENEMA | Freq: Once | RECTAL | Status: DC | PRN

## 2017-05-11 MED ORDER — DOXYCYCLINE HYCLATE 100 MG PO TABS
100.0000 mg | ORAL_TABLET | Freq: Two times a day (BID) | ORAL | Status: DC
Start: 2017-05-11 — End: 2017-05-11

## 2017-05-11 MED ORDER — CEFAZOLIN SODIUM-DEXTROSE 1-4 GM/50ML-% IV SOLN
1.0000 g | Freq: Three times a day (TID) | INTRAVENOUS | Status: AC
  Administered 2017-05-11 (×2): 1 g via INTRAVENOUS
  Filled 2017-05-11 (×3): qty 50

## 2017-05-11 MED ORDER — ACETAMINOPHEN 650 MG RE SUPP: 650.0000 mg | RECTAL | Status: DC | PRN

## 2017-05-11 MED ORDER — POLYETHYLENE GLYCOL 3350 17 G PO PACK
17.0000 g | PACK | Freq: Every day | ORAL | Status: DC | PRN
  Filled 2017-05-11: qty 1

## 2017-05-11 MED ORDER — THROMBIN (RECOMBINANT) 5000 UNITS EX SOLR
CUTANEOUS | Status: AC
  Filled 2017-05-11: qty 5000

## 2017-05-11 MED ORDER — LACTATED RINGERS IV SOLN
INTRAVENOUS | Status: DC
  Administered 2017-05-11 (×2): via INTRAVENOUS

## 2017-05-11 MED ORDER — PROPOFOL 10 MG/ML IV BOLUS
INTRAVENOUS | Status: DC | PRN
  Administered 2017-05-11: 30 mg via INTRAVENOUS
  Administered 2017-05-11: 140 mg via INTRAVENOUS
  Administered 2017-05-11: 30 mg via INTRAVENOUS

## 2017-05-11 MED ORDER — OXYCODONE HCL 5 MG PO TABS
10.0000 mg | ORAL_TABLET | ORAL | Status: DC | PRN
  Administered 2017-05-11 – 2017-05-12 (×5): 10 mg via ORAL
  Filled 2017-05-11 (×5): qty 2

## 2017-05-11 MED ORDER — LISINOPRIL-HYDROCHLOROTHIAZIDE 20-25 MG PO TABS: 1.0000 | ORAL_TABLET | Freq: Every day | ORAL | Status: DC

## 2017-05-11 MED ORDER — PHENOL 1.4 % MT LIQD: 1.0000 | OROMUCOSAL | Status: DC | PRN

## 2017-05-11 MED ORDER — TRAMADOL HCL 50 MG PO TABS: 25.0000 mg | ORAL_TABLET | ORAL | Status: DC | PRN

## 2017-05-11 MED ORDER — PROPOFOL 10 MG/ML IV BOLUS
INTRAVENOUS | Status: AC
  Filled 2017-05-11: qty 20

## 2017-05-11 MED ORDER — DIVALPROEX SODIUM 500 MG PO DR TAB
500.0000 mg | DELAYED_RELEASE_TABLET | Freq: Every day | ORAL | Status: DC
  Filled 2017-05-11: qty 1

## 2017-05-11 MED ORDER — LISINOPRIL 20 MG PO TABS: 20.0000 mg | ORAL_TABLET | Freq: Every day | ORAL | Status: DC

## 2017-05-11 MED ORDER — SODIUM CHLORIDE 0.9 % IV SOLN: 250.0000 mL | INTRAVENOUS | Status: DC

## 2017-05-11 MED ORDER — ATORVASTATIN CALCIUM 20 MG PO TABS
40.0000 mg | ORAL_TABLET | Freq: Every morning | ORAL | Status: DC
  Administered 2017-05-11: 40 mg via ORAL
  Filled 2017-05-11: qty 2

## 2017-05-11 MED ORDER — MENTHOL 3 MG MT LOZG: 1.0000 | LOZENGE | OROMUCOSAL | Status: DC | PRN

## 2017-05-11 MED ORDER — HYDROMORPHONE HCL 1 MG/ML IJ SOLN
1.0000 mg | INTRAMUSCULAR | Status: DC | PRN
  Administered 2017-05-11 (×2): 1 mg via INTRAVENOUS
  Filled 2017-05-11 (×2): qty 1

## 2017-05-11 MED ORDER — DIAZEPAM 5 MG PO TABS
5.0000 mg | ORAL_TABLET | Freq: Four times a day (QID) | ORAL | Status: DC | PRN
  Administered 2017-05-11 – 2017-05-12 (×4): 10 mg via ORAL
  Filled 2017-05-11 (×4): qty 2

## 2017-05-11 MED ORDER — MIDAZOLAM HCL 5 MG/5ML IJ SOLN
INTRAMUSCULAR | Status: DC | PRN
  Administered 2017-05-11: 2 mg via INTRAVENOUS

## 2017-05-11 MED ORDER — PHENYLEPHRINE HCL 10 MG/ML IJ SOLN
INTRAVENOUS | Status: DC | PRN
  Administered 2017-05-11: 20 ug/min via INTRAVENOUS

## 2017-05-11 MED ORDER — THROMBIN (RECOMBINANT) 20000 UNITS EX SOLR
CUTANEOUS | Status: AC
  Filled 2017-05-11: qty 20000

## 2017-05-11 MED ORDER — SODIUM CHLORIDE 0.9% FLUSH: 3.0000 mL | INTRAVENOUS | Status: DC | PRN

## 2017-05-11 MED ORDER — HYDROCODONE-ACETAMINOPHEN 10-325 MG PO TABS: 1.0000 | ORAL_TABLET | ORAL | Status: DC | PRN

## 2017-05-11 MED ORDER — HYDROCHLOROTHIAZIDE 25 MG PO TABS: 25.0000 mg | ORAL_TABLET | Freq: Every day | ORAL | Status: DC

## 2017-05-11 MED ORDER — BISACODYL 10 MG RE SUPP: 10.0000 mg | Freq: Every day | RECTAL | Status: DC | PRN

## 2017-05-11 MED ORDER — VANCOMYCIN HCL 1000 MG IV SOLR
INTRAVENOUS | Status: AC
  Filled 2017-05-11: qty 1000

## 2017-05-11 MED ORDER — PHENYLEPHRINE HCL 10 MG/ML IJ SOLN
INTRAMUSCULAR | Status: DC | PRN
  Administered 2017-05-11 (×3): 80 ug via INTRAVENOUS

## 2017-05-11 MED ORDER — FENTANYL CITRATE (PF) 100 MCG/2ML IJ SOLN
INTRAMUSCULAR | Status: DC | PRN
  Administered 2017-05-11: 100 ug via INTRAVENOUS
  Administered 2017-05-11: 50 ug via INTRAVENOUS
  Administered 2017-05-11: 150 ug via INTRAVENOUS
  Administered 2017-05-11 (×5): 50 ug via INTRAVENOUS

## 2017-05-11 MED ORDER — ONDANSETRON HCL 4 MG/2ML IJ SOLN: 4.0000 mg | Freq: Four times a day (QID) | INTRAMUSCULAR | Status: DC | PRN

## 2017-05-11 MED ORDER — PHENYLEPHRINE 40 MCG/ML (10ML) SYRINGE FOR IV PUSH (FOR BLOOD PRESSURE SUPPORT)
PREFILLED_SYRINGE | INTRAVENOUS | Status: AC
  Filled 2017-05-11: qty 10

## 2017-05-11 MED ORDER — FLUTICASONE PROPIONATE 50 MCG/ACT NA SUSP
1.0000 | Freq: Every day | NASAL | Status: DC
  Filled 2017-05-11: qty 16

## 2017-05-11 MED ORDER — DEXMEDETOMIDINE HCL IN NACL 200 MCG/50ML IV SOLN
INTRAVENOUS | Status: DC | PRN
  Administered 2017-05-11 (×5): 8 ug via INTRAVENOUS

## 2017-05-11 MED ORDER — MIDAZOLAM HCL 2 MG/2ML IJ SOLN: 0.5000 mg | Freq: Once | INTRAMUSCULAR | Status: DC | PRN

## 2017-05-11 MED ORDER — HYDROMORPHONE HCL 1 MG/ML IJ SOLN
0.2500 mg | INTRAMUSCULAR | Status: DC | PRN
  Administered 2017-05-11 (×3): 0.25 mg via INTRAVENOUS

## 2017-05-11 MED ORDER — MEPERIDINE HCL 25 MG/ML IJ SOLN: 6.2500 mg | INTRAMUSCULAR | Status: DC | PRN

## 2017-05-11 MED ORDER — PROMETHAZINE HCL 25 MG/ML IJ SOLN: 6.2500 mg | INTRAMUSCULAR | Status: DC | PRN

## 2017-05-11 MED ORDER — IBUPROFEN 200 MG PO TABS: 400.0000 mg | ORAL_TABLET | Freq: Three times a day (TID) | ORAL | Status: DC | PRN

## 2017-05-11 MED ORDER — ONDANSETRON HCL 4 MG PO TABS: 4.0000 mg | ORAL_TABLET | Freq: Four times a day (QID) | ORAL | Status: DC | PRN

## 2017-05-11 MED ORDER — LIDOCAINE HCL (CARDIAC) 20 MG/ML IV SOLN
INTRAVENOUS | Status: DC | PRN
  Administered 2017-05-11: 60 mg via INTRAVENOUS

## 2017-05-11 MED FILL — Heparin Sodium (Porcine) Inj 1000 Unit/ML: INTRAMUSCULAR | Qty: 30 | Status: AC

## 2017-05-11 MED FILL — Sodium Chloride IV Soln 0.9%: INTRAVENOUS | Qty: 1000 | Status: AC

## 2017-05-11 SURGICAL SUPPLY — 62 items
ADH SKN CLS APL DERMABOND .7 (GAUZE/BANDAGES/DRESSINGS) ×1
APL SKNCLS STERI-STRIP NONHPOA (GAUZE/BANDAGES/DRESSINGS) ×1
BAG DECANTER FOR FLEXI CONT (MISCELLANEOUS) ×2 IMPLANT
BENZOIN TINCTURE PRP APPL 2/3 (GAUZE/BANDAGES/DRESSINGS) ×2 IMPLANT
BLADE CLIPPER SURG (BLADE) IMPLANT
BUR CUTTER 7.0 ROUND (BURR) IMPLANT
BUR MATCHSTICK NEURO 3.0 LAGG (BURR) ×2 IMPLANT
CANISTER SUCT 3000ML PPV (MISCELLANEOUS) ×2 IMPLANT
CAP LCK SPNE (Orthopedic Implant) ×4 IMPLANT
CAP LOCK SPINE RADIUS (Orthopedic Implant) IMPLANT
CAP LOCKING (Orthopedic Implant) ×8 IMPLANT
CARTRIDGE OIL MAESTRO DRILL (MISCELLANEOUS) ×1 IMPLANT
CONT SPEC 4OZ CLIKSEAL STRL BL (MISCELLANEOUS) ×2 IMPLANT
COVER BACK TABLE 60X90IN (DRAPES) ×2 IMPLANT
DECANTER SPIKE VIAL GLASS SM (MISCELLANEOUS) ×2 IMPLANT
DERMABOND ADVANCED (GAUZE/BANDAGES/DRESSINGS) ×1
DERMABOND ADVANCED .7 DNX12 (GAUZE/BANDAGES/DRESSINGS) ×1 IMPLANT
DEVICE INTERBODY ELEVATE 23X10 (Cage) ×2 IMPLANT
DIFFUSER DRILL AIR PNEUMATIC (MISCELLANEOUS) ×2 IMPLANT
DRAPE C-ARM 42X72 X-RAY (DRAPES) ×4 IMPLANT
DRAPE HALF SHEET 40X57 (DRAPES) IMPLANT
DRAPE LAPAROTOMY 100X72X124 (DRAPES) ×2 IMPLANT
DRAPE POUCH INSTRU U-SHP 10X18 (DRAPES) ×2 IMPLANT
DRAPE SURG 17X23 STRL (DRAPES) ×8 IMPLANT
DRSG OPSITE POSTOP 4X6 (GAUZE/BANDAGES/DRESSINGS) ×1 IMPLANT
DURAPREP 26ML APPLICATOR (WOUND CARE) ×2 IMPLANT
ELECT REM PT RETURN 9FT ADLT (ELECTROSURGICAL) ×2
ELECTRODE REM PT RTRN 9FT ADLT (ELECTROSURGICAL) ×1 IMPLANT
EVACUATOR 1/8 PVC DRAIN (DRAIN) ×2 IMPLANT
GAUZE SPONGE 4X4 12PLY STRL (GAUZE/BANDAGES/DRESSINGS) IMPLANT
GAUZE SPONGE 4X4 16PLY XRAY LF (GAUZE/BANDAGES/DRESSINGS) IMPLANT
GLOVE ECLIPSE 9.0 STRL (GLOVE) ×4 IMPLANT
GLOVE EXAM NITRILE LRG STRL (GLOVE) IMPLANT
GLOVE EXAM NITRILE XL STR (GLOVE) IMPLANT
GLOVE EXAM NITRILE XS STR PU (GLOVE) IMPLANT
GOWN STRL REUS W/ TWL LRG LVL3 (GOWN DISPOSABLE) IMPLANT
GOWN STRL REUS W/ TWL XL LVL3 (GOWN DISPOSABLE) ×2 IMPLANT
GOWN STRL REUS W/TWL 2XL LVL3 (GOWN DISPOSABLE) IMPLANT
GOWN STRL REUS W/TWL LRG LVL3 (GOWN DISPOSABLE)
GOWN STRL REUS W/TWL XL LVL3 (GOWN DISPOSABLE) ×4
KIT BASIN OR (CUSTOM PROCEDURE TRAY) ×2 IMPLANT
KIT ROOM TURNOVER OR (KITS) ×2 IMPLANT
MILL MEDIUM DISP (BLADE) ×2 IMPLANT
NEEDLE HYPO 22GX1.5 SAFETY (NEEDLE) ×2 IMPLANT
NS IRRIG 1000ML POUR BTL (IV SOLUTION) ×2 IMPLANT
OIL CARTRIDGE MAESTRO DRILL (MISCELLANEOUS) ×2
PACK LAMINECTOMY NEURO (CUSTOM PROCEDURE TRAY) ×2 IMPLANT
ROD RADIUS 40MM (Neuro Prosthesis/Implant) ×4 IMPLANT
ROD SPNL 40X5.5XNS TI RDS (Neuro Prosthesis/Implant) IMPLANT
SCREW 6.75X45MM (Screw) ×1 IMPLANT
SCREW 6.75X50MM (Screw) ×2 IMPLANT
SCREW 6.75X55MM (Screw) ×2 IMPLANT
SPONGE SURGIFOAM ABS GEL 100 (HEMOSTASIS) ×2 IMPLANT
STRIP CLOSURE SKIN 1/2X4 (GAUZE/BANDAGES/DRESSINGS) ×3 IMPLANT
SUT VIC AB 0 CT1 18XCR BRD8 (SUTURE) ×2 IMPLANT
SUT VIC AB 0 CT1 8-18 (SUTURE) ×4
SUT VIC AB 2-0 CT1 18 (SUTURE) ×2 IMPLANT
SUT VIC AB 3-0 SH 8-18 (SUTURE) ×4 IMPLANT
TOWEL GREEN STERILE (TOWEL DISPOSABLE) ×2 IMPLANT
TOWEL GREEN STERILE FF (TOWEL DISPOSABLE) ×2 IMPLANT
TRAY FOLEY W/METER SILVER 16FR (SET/KITS/TRAYS/PACK) ×2 IMPLANT
WATER STERILE IRR 1000ML POUR (IV SOLUTION) ×2 IMPLANT

## 2017-05-11 NOTE — H&P (Signed)
Daniel Villanueva is an 57 y.o. male.   Chief Complaint: Back pain HPI: 57 year old male with progressive back and bilateral lower extremity pain failing conservative management.  Workup demonstrates evidence of an early grade 1 L4-5 degenerative spondylolisthesis with accompanying facet arthropathy and foraminal stenosis.  Patient presents now for L4-5 decompression and fusion.  Past Medical History:  Diagnosis Date  . Arthritis    back, neck &hands   . GERD (gastroesophageal reflux disease)   . Headache    controlled on Depakote  . High cholesterol   . History of hiatal hernia   . Hypertension   . Spondylolisthesis of lumbar region     Past Surgical History:  Procedure Laterality Date  . EYE SURGERY     bot eyes- cataracts removed- /w IOL  . HEMORROIDECTOMY  1988  . HERNIA REPAIR  1998   umbilical hernia   . TONSILLECTOMY      No family history on file. Social History:  reports that he quit smoking about 29 years ago. He quit after 10.00 years of use. he has never used smokeless tobacco. He reports that he drinks alcohol. He reports that he does not use drugs.  Allergies:  Allergies  Allergen Reactions  . Adhesive [Tape] Dermatitis    Skin irritation     Medications Prior to Admission  Medication Sig Dispense Refill  . atorvastatin (LIPITOR) 40 MG tablet Take 40 mg by mouth every morning.    . divalproex (DEPAKOTE) 250 MG DR tablet Take 1 tablet (250 mg total) by mouth every 12 (twelve) hours. (Patient taking differently: Take 500 mg by mouth daily. ) 60 tablet 0  . doxycycline (VIBRA-TABS) 100 MG tablet Take 100 mg by mouth 2 (two) times daily.    . fluticasone (FLONASE) 50 MCG/ACT nasal spray Place 1 spray into both nostrils daily.    Marland Kitchen. ibuprofen (ADVIL,MOTRIN) 200 MG tablet Take 400 mg by mouth every 8 (eight) hours as needed for mild pain.    Marland Kitchen. lisinopril-hydrochlorothiazide (PRINZIDE,ZESTORETIC) 20-25 MG tablet Take 1 tablet by mouth daily.    Marland Kitchen. omeprazole (PRILOSEC)  20 MG capsule Take 20 mg by mouth every morning.    . psyllium (METAMUCIL) 58.6 % powder Take 1 packet by mouth every evening.    . traMADol (ULTRAM) 50 MG tablet Take 25-50 mg by mouth every 4 (four) hours as needed for moderate pain.     Marland Kitchen. zolpidem (AMBIEN) 5 MG tablet Take 5-10 mg by mouth at bedtime as needed for sleep.  5  . topiramate (TOPAMAX) 25 MG tablet Take 50 mg by mouth 2 (two) times daily.  0    No results found for this or any previous visit (from the past 48 hour(s)). No results found.  Pertinent items noted in HPI and remainder of comprehensive ROS otherwise negative.  Blood pressure (!) 152/103, pulse 80, temperature 98.3 F (36.8 C), temperature source Oral, resp. rate 19, height 6\' 2"  (1.88 m), weight 124.7 kg (275 lb), SpO2 99 %.  Patient is awake and alert.  He is oriented and appropriate.  Speech is fluent.  Judgment and insight are intact.  Cranial nerve function normal bilaterally.  Motor examination with some mild dorsiflexion weakness on the right lower extremity otherwise motor strength intact.  Sensory examination with some decreased sensation.  Light touch in his right L4 and L5 dermatomes.  Deep tender mixes normal active in both upper extremities somewhat hypoactive in his patella and absent in his Achilles bilaterally.  No  evidence of long track signs.  Gait and posture reasonably normal.  Examination of the head ears eyes nose throat is unremarkable her chest and abdomen are benign.  Extremities are free of major deformity. Assessment/Plan L4-L5 grade 1 degenerative spondylolisthesis with foraminal stenosis.  Plan bilateral L4-5 decompressive laminotomies and foraminotomies followed by posterior lumbar interbody fusion utilizing interbody cages, locally harvested autograft, and augmented with posterior lateral arthrodesis utilizing nonsegmental pedicle screw fixation and local autograft.  Risks and benefits of been explained.  Patient wishes to proceed.  Daniel Villanueva  Daniel Villanueva 05/11/2017, 7:48 AM

## 2017-05-11 NOTE — Anesthesia Postprocedure Evaluation (Signed)
Anesthesia Post Note  Patient: Cyndi Benderaul C Bensman  Procedure(s) Performed: PLIF - L4-L5 (N/A Back)     Patient location during evaluation: PACU Anesthesia Type: General Level of consciousness: awake and alert, patient cooperative and oriented Pain management: pain level controlled Vital Signs Assessment: post-procedure vital signs reviewed and stable Respiratory status: spontaneous breathing, nonlabored ventilation, respiratory function stable and patient connected to nasal cannula oxygen Cardiovascular status: blood pressure returned to baseline and stable Postop Assessment: no apparent nausea or vomiting Anesthetic complications: no    Last Vitals:  Vitals:   05/11/17 1230 05/11/17 1240  BP: 108/85 96/66  Pulse: 83 72  Resp: 14 16  Temp:  36.7 C  SpO2: 95% 94%    Last Pain:  Vitals:   05/11/17 1340  TempSrc:   PainSc: 10-Worst pain ever                 Darika Ildefonso,E. Daymian Lill

## 2017-05-11 NOTE — Evaluation (Signed)
Physical Therapy Evaluation Patient Details Name: Daniel Villanueva MRN: [id number] DOB: Sep 27, 1960 Today's Date: 05/11/2017   History of Present Illness  Pt is a 57 y/o male s/p L4-5 PLIF. PMH includes HTN.   Clinical Impression  Patient is s/p above surgery resulting in the deficits listed below (see PT Problem List). PTA, pt was independent with functional mobility. Upon eval, pt presenting with post op pain, weakness, decreased balance, and grogginess. Pt required use of RW this session to increase steadiness and required min to min guard assist throughout. Anticipate pt will progress well and will improve steadiness once grogginess improves, however, will reassess need for AD in next session. Reports wife will be able to assist as needed upon d/c and has access to RW if needed.  Patient will benefit from skilled PT to increase their independence and safety with mobility (while adhering to their precautions) to allow discharge to the venue listed below. Will continue to follow acutely.      Follow Up Recommendations No PT follow up;Supervision for mobility/OOB    Equipment Recommendations  Other (comment)(AD TBD in next session )    Recommendations for Other Services       Precautions / Restrictions Precautions Precautions: Back Precaution Booklet Issued: Yes (comment) Precaution Comments: Reviewed back precautions handout. Pt groggy, so will likely need review.  Restrictions Weight Bearing Restrictions: No      Mobility  Bed Mobility Overal bed mobility: Needs Assistance Bed Mobility: Rolling;Sidelying to Sit Rolling: Supervision Sidelying to sit: Min guard       General bed mobility comments: Min guard for safety during transfer. Verbal cues for use of log roll technique.   Transfers Overall transfer level: Needs assistance Equipment used: Rolling walker (2 wheeled) Transfers: Sit to/from Stand Sit to Stand: Min assist         General transfer comment: Min A for lift  assist and steadying assist. Verbal cues for safe hand placement and to power through LEs.   Ambulation/Gait Ambulation/Gait assistance: Min assist;Min guard Ambulation Distance (Feet): 200 Feet Assistive device: Rolling walker (2 wheeled)(IV pole ) Gait Pattern/deviations: Step-through pattern;Decreased stride length Gait velocity: Decreased Gait velocity interpretation: Below normal speed for age/gender General Gait Details: Slow, unsteady gait without use of AD, most likely secondary to grogginess. Improved steadiness with use of RW. Educated about generalized walking program to perform at home.   Stairs            Wheelchair Mobility    Modified Rankin (Stroke Patients Only)       Balance Overall balance assessment: Needs assistance Sitting-balance support: No upper extremity supported;Feet supported Sitting balance-Leahy Scale: Good     Standing balance support: Bilateral upper extremity supported;During functional activity Standing balance-Leahy Scale: Poor Standing balance comment: Reliant on BUE support.                              Pertinent Vitals/Pain Pain Assessment: 0-10 Pain Score: 7  Pain Location: back  Pain Descriptors / Indicators: Aching;Operative site guarding Pain Intervention(s): Limited activity within patient's tolerance;Monitored during session;Repositioned    Home Living Family/patient expects to be discharged to:: Private residence Living Arrangements: Spouse/significant other Available Help at Discharge: Family;Available 24 hours/day Type of Home: House Home Access: Stairs to enter Entrance Stairs-Rails: None Entrance Stairs-Number of Steps: 2 Home Layout: One level Home Equipment: None      Prior Function Level of Independence: Independent  Hand Dominance        Extremity/Trunk Assessment   Upper Extremity Assessment Upper Extremity Assessment: Defer to OT evaluation    Lower Extremity  Assessment Lower Extremity Assessment: Generalized weakness    Cervical / Trunk Assessment Cervical / Trunk Assessment: Other exceptions Cervical / Trunk Exceptions: s/p PLIF   Communication   Communication: No difficulties  Cognition Arousal/Alertness: Suspect due to medications Behavior During Therapy: WFL for tasks assessed/performed Overall Cognitive Status: Within Functional Limits for tasks assessed                                 General Comments: Pt groggy during session.       General Comments General comments (skin integrity, edema, etc.): Pt's wife present during session.     Exercises     Assessment/Plan    PT Assessment Patient needs continued PT services  PT Problem List Decreased strength;Decreased mobility;Decreased balance;Decreased knowledge of use of DME;Decreased knowledge of precautions;Pain       PT Treatment Interventions DME instruction;Gait training;Stair training;Functional mobility training;Therapeutic activities;Balance training;Therapeutic exercise;Neuromuscular re-education;Patient/family education    PT Goals (Current goals can be found in the Care Plan section)  Acute Rehab PT Goals Patient Stated Goal: to go home  PT Goal Formulation: With patient Time For Goal Achievement: 05/25/17 Potential to Achieve Goals: Good    Frequency Min 5X/week   Barriers to discharge        Co-evaluation               AM-PAC PT "6 Clicks" Daily Activity  Outcome Measure Difficulty turning over in bed (including adjusting bedclothes, sheets and blankets)?: A Little Difficulty moving from lying on back to sitting on the side of the bed? : Unable Difficulty sitting down on and standing up from a chair with arms (e.g., wheelchair, bedside commode, etc,.)?: Unable Help needed moving to and from a bed to chair (including a wheelchair)?: A Little Help needed walking in hospital room?: A Little Help needed climbing 3-5 steps with a  railing? : A Lot 6 Click Score: 13    End of Session Equipment Utilized During Treatment: Back brace;Gait belt Activity Tolerance: Patient tolerated treatment well Patient left: in chair;with call bell/phone within reach;with family/visitor present Nurse Communication: Mobility status PT Visit Diagnosis: Unsteadiness on feet (R26.81);Other abnormalities of gait and mobility (R26.89);Pain Pain - part of body: (back )    Time: 1429-1501 PT Time Calculation (min) (ACUTE ONLY): 32 min   Charges:   PT Evaluation $PT Eval Low Complexity: 1 Low PT Treatments $Gait Training: 8-22 mins   PT G Codes:        Gladys Damme, PT, DPT  Acute Rehabilitation Services  Pager: [phone]   Lehman Prom 05/11/2017, 3:16 PM

## 2017-05-11 NOTE — Transfer of Care (Signed)
Immediate Anesthesia Transfer of Care Note  Patient: Daniel Villanueva  Procedure(s) Performed: PLIF - L4-L5 (N/A Back)  Patient Location: PACU  Anesthesia Type:General  Level of Consciousness: awake and patient cooperative  Airway & Oxygen Therapy: Patient Spontanous Breathing  Post-op Assessment: Report given to RN and Post -op Vital signs reviewed and stable  Post vital signs: Reviewed and stable  Last Vitals:  Vitals:   05/11/17 0626  BP: (!) 152/103  Pulse: 80  Resp: 19  Temp: 36.8 C  SpO2: 99%    Last Pain:  Vitals:   05/11/17 0704  TempSrc:   PainSc: 6       Patients Stated Pain Goal: 2 (05/11/17 0704)  Complications: No apparent anesthesia complications

## 2017-05-11 NOTE — Anesthesia Procedure Notes (Signed)
Procedure Name: Intubation Date/Time: 05/11/2017 8:39 AM Performed by: Rosiland OzMeyers, Jarrius Huaracha, CRNA Pre-anesthesia Checklist: Patient identified, Emergency Drugs available, Suction available, Patient being monitored and Timeout performed Patient Re-evaluated:Patient Re-evaluated prior to induction Oxygen Delivery Method: Circle system utilized Preoxygenation: Pre-oxygenation with 100% oxygen Induction Type: IV induction Ventilation: Mask ventilation without difficulty and Oral airway inserted - appropriate to patient size Laryngoscope Size: Miller and 3 Grade View: Grade I Tube type: Oral Tube size: 7.5 mm Number of attempts: 1 Airway Equipment and Method: Stylet Placement Confirmation: ETT inserted through vocal cords under direct vision,  breath sounds checked- equal and bilateral and positive ETCO2 Secured at: 23 cm Tube secured with: Tape Dental Injury: Teeth and Oropharynx as per pre-operative assessment

## 2017-05-11 NOTE — Brief Op Note (Signed)
05/11/2017  11:18 AM  PATIENT:  Daniel Villanueva  57 y.o. male  PRE-OPERATIVE DIAGNOSIS:  Spondylolisthesis  POST-OPERATIVE DIAGNOSIS:  spondylolisthesis  PROCEDURE:  Procedure(s): PLIF - L4-L5 (N/A)  SURGEON:  Surgeon(s) and Role:    * Julio SicksPool, Constantina Laseter, MD - Primary    * Coletta Memosabbell, Kyle, MD - Assisting  PHYSICIAN ASSISTANT:   ASSISTANTS:    ANESTHESIA:   general  EBL:  200 mL   BLOOD ADMINISTERED:none  DRAINS: none   LOCAL MEDICATIONS USED:  MARCAINE     SPECIMEN:  No Specimen  DISPOSITION OF SPECIMEN:  N/A  COUNTS:  YES  TOURNIQUET:  * No tourniquets in log *  DICTATION: .Dragon Dictation  PLAN OF CARE: Admit to inpatient   PATIENT DISPOSITION:  PACU - hemodynamically stable.   Delay start of Pharmacological VTE agent (>24hrs) due to surgical blood loss or risk of bleeding: yes

## 2017-05-11 NOTE — Op Note (Signed)
Date of procedure: 05/11/2017  Date of dictation: Same  Service: Neurosurgery  Preoperative diagnosis: Grade 1 L4-5 degenerative spondylolisthesis with foraminal stenosis  Postoperative diagnosis: Same  Procedure Name: Bilateral L4-5 decompressive laminotomies with bilateral L4 and L5 decompressive foraminotomies, more than would be required for simple interbody fusion alone.  L4-5 posterior lumbar interbody fusion utilizing interbody expandable cages and locally harvested autograft  L4-5 posterior lateral arthrodesis utilizing nonsegmental pedicle screw fixation and local autograft  Surgeon:Dashawna Delbridge A.Errick Salts, M.D.  Asst. Surgeon: Franky Machoabbell  Anesthesia: General  Indication: 57 year old male with severe back and bilateral lower extremity pain right greater than left failing conservative management.  Workup demonstrates evidence of an early grade 1 L4-5 degenerative spondylolisthesis with severe facet arthropathy and marked foraminal stenosis.  Patient is failed conservative management presents now for L4-5 decompression and fusion in hopes of improving his symptoms.  Operative note: After induction of anesthesia, patient position prone on the Wilson frame and properly padded.  Lumbar region prepped and draped sterilely.  Incision made overlying L4-5.  Dissection performed bilaterally.  Retractor placed.  Fluoroscopy used.  Levels confirmed.  Decompressive laminotomies of performed using high-speed drill and Kerrison rongeurs to remove the inferior one half of the lamina of L4.  The entire pars interarticularis and inferior facets bilaterally and the majority the superior facets bilaterally.  All bone is clean and used later autografting.  Ligament flavum elevated and resected.  Foraminotomies completed on the course exiting L4 and L5 nerve roots bilaterally.  Epidural venous plexus coagulated and cut.  Bilateral discectomies and performed.  The space then prepared for interbody fusion.  Disc space  distractor placed in the patient's right side.  Disc space cleaned of soft tissue on the left side.  10 mm Medtronic expandable cage packed with locally harvested autograft was impacted into place and expanded to its full extent.  Distractor removed patient's right side.  Disc space prepared on the right side.  Morselized autograft packed in the interspace.  A second cage packed with autograft was then impacted into place and expanded to its full extent.  The pedicles at L4 and L5 were then identified using surface landmarks and intraoperative fluoroscopy.  Superficial bone overlying the pedicle was then removed using a high-speed drill.  Each pedicle was then probed using a pedicle awl each pedicle tract was probed and found to be solidly within the bone.  Each pedicle tract was tapped with a screw tapped and probed once again.  6.75 mm Stryker radius bran screws were placed bilaterally.  Final images reveal good position of the cages and the pedicle screws the proper operative level with normal alignment of spine.  Wound is then irrigated one final time.  Transverse processes and residual facets were decorticated.  Morselized autograft was packed posterior laterally for later fusion.  Short segment titanium rod placed over the screw heads at L4 and L5.  Locking caps were then placed over the screws.  Locking caps and engaged with a construct under mild compression.  Gelfoam placed over the laminotomy defects.  Vancomycin powder placed in the deep wound space.  Wounds and closed in layers.  Steri-Strips and sterile dressing were applied.  No apparent complications.  Patient tolerated the procedure well and he returns to the recovery room postop.

## 2017-05-12 MED ORDER — OXYCODONE HCL 10 MG PO TABS: 5.0000 mg | ORAL_TABLET | Freq: Four times a day (QID) | ORAL | 0 refills | Status: DC | PRN

## 2017-05-12 MED ORDER — DIAZEPAM 5 MG PO TABS: 5.0000 mg | ORAL_TABLET | Freq: Four times a day (QID) | ORAL | 0 refills | Status: DC | PRN

## 2017-05-12 NOTE — Progress Notes (Addendum)
Patient is discharged from room 3C08 at this time. Alert and in stable condition. IV site d/c'd and instructions read to patient and daughter with understanding verbalized. Left unit via wheelchair with all belongings at side. 

## 2017-05-12 NOTE — Evaluation (Signed)
Occupational Therapy Evaluation Patient Details Name: Daniel Villanueva MRN: [id number] DOB: 12/22/1960 Today's Date: 05/12/2017    History of Present Illness Pt is a 57 y/o male s/p L4-5 PLIF. PMH includes HTN.    Clinical Impression   PTA, pt was living with his wife and was independent. Currently, pt performing ADLs and functional mobility at [employer] level. Provided education on brace management, LB ADLs, toileting, and shower transfer; pt demonstrated understanding. Answered all pt questions. Recommend dc home once medically stable per physician. All acute OT needs met and will sign off. Thank you.    Follow Up Recommendations  No OT follow up;Supervision - Intermittent    Equipment Recommendations  None recommended by OT    Recommendations for Other Services PT consult     Precautions / Restrictions Precautions Precautions: Back Precaution Booklet Issued: Yes (comment) Precaution Comments: Reviewed back precautions handout. Pt groggy, so will likely need review.  Required Braces or Orthoses: Spinal Brace Spinal Brace: Thoracolumbosacral orthotic;Applied in sitting position Restrictions Weight Bearing Restrictions: No      Mobility Bed Mobility               General bed mobility comments: Pt OOB upon arrival  Transfers Overall transfer level: Needs assistance Equipment used: None Transfers: Sit to/from Stand Sit to Stand: Supervision         General transfer comment: supervision for safety    Balance Overall balance assessment: Needs assistance Sitting-balance support: No upper extremity supported;Feet supported Sitting balance-Leahy Scale: Good     Standing balance support: Bilateral upper extremity supported;During functional activity Standing balance-Leahy Scale: Fair Standing balance comment: Able to maintain static standing without UE support                           ADL either performed or assessed with clinical judgement    ADL Overall ADL's : Needs assistance/impaired Eating/Feeding: Set up;Sitting   Grooming: Set up;Standing Grooming Details (indicate cue type and reason): Educated pt on compensatory techniques to adhere to back precautions Upper Body Bathing: Set up;Sitting   Lower Body Bathing: Min guard;Sit to/from stand   Upper Body Dressing : Set up;Sitting Upper Body Dressing Details (indicate cue type and reason): Educated pt on compensatory techniques to adhere to back precautions Lower Body Dressing: Min guard;Sitting/lateral leans Lower Body Dressing Details (indicate cue type and reason): Educated pt on compensatory techniques to adhere to back precautions Toilet Transfer: Set up;Ambulation;Supervision/safety     Toileting - Clothing Manipulation Details (indicate cue type and reason): Educated pt on compensatory techniques to adhere to back precautions   Tub/Shower Transfer Details (indicate cue type and reason): Discussed safe shoer transfer Functional mobility during ADLs: Supervision/safety General ADL Comments: Pt performing ADLs and fucntional mobility with supervision-Min Guard A. Provided education on back precautions, LB ADLs, toileting, and shower toilet/shower transfer. Pt demonstrating and verbalizing understanding.     Vision         Perception     Praxis      Pertinent Vitals/Pain Pain Assessment: Faces Faces Pain Scale: Hurts even more Pain Location: back  Pain Descriptors / Indicators: Aching;Operative site guarding Pain Intervention(s): Monitored during session;Limited activity within patient's tolerance;Repositioned     Hand Dominance Right   Extremity/Trunk Assessment Upper Extremity Assessment Upper Extremity Assessment: Overall WFL for tasks assessed   Lower Extremity Assessment Lower Extremity Assessment: Defer to PT evaluation   Cervical / Trunk Assessment Cervical / Trunk Assessment: Other  exceptions Cervical / Trunk Exceptions: s/p PLIF     Communication Communication Communication: No difficulties   Cognition Arousal/Alertness: Awake/alert Behavior During Therapy: WFL for tasks assessed/performed Overall Cognitive Status: Within Functional Limits for tasks assessed                                     General Comments  Daughter arriving at end of session    Exercises     Shoulder Instructions      Home Living Family/patient expects to be discharged to:: Private residence Living Arrangements: Spouse/significant other Available Help at Discharge: Family;Available 24 hours/day Type of Home: House Home Access: Stairs to enter [employer] of Steps: 2 Entrance Stairs-Rails: None Home Layout: One level     Bathroom Shower/Tub: Occupational psychologist: Handicapped height     Home Equipment: Hand held shower head          Prior Functioning/Environment Level of Independence: Independent                 OT Problem List: Decreased range of motion;Impaired balance (sitting and/or standing);Decreased knowledge of use of DME or AE;Decreased knowledge of precautions;Pain      OT Treatment/Interventions:      OT Goals(Current goals can be found in the care plan section) Acute Rehab OT Goals Patient Stated Goal: to go home  OT Goal Formulation: With patient Time For Goal Achievement: 05/26/17 Potential to Achieve Goals: Good  OT Frequency:     Barriers to D/C:            Co-evaluation              AM-PAC PT "6 Clicks" Daily Activity     Outcome Measure Help from another person eating meals?: None Help from another person taking care of personal grooming?: None Help from another person toileting, which includes using toliet, bedpan, or urinal?: A Little Help from another person bathing (including washing, rinsing, drying)?: A Little Help from another person to put on and taking off regular upper body clothing?: A Little Help from another person to put on and  taking off regular lower body clothing?: A Little 6 Click Score: 20   End of Session Equipment Utilized During Treatment: Back brace Nurse Communication: Mobility status;Precautions  Activity Tolerance: Patient tolerated treatment well Patient left: Other (comment);with family/visitor present;with nursing/sitter in room(OOB and walking)  OT Visit Diagnosis: Unsteadiness on feet (R26.81);Other abnormalities of gait and mobility (R26.89);Muscle weakness (generalized) (M62.81);Pain Pain - part of body: (Back)                Time: 1537-9432 OT Time Calculation (min): 16 min Charges:  OT General Charges $OT Visit: 1 Visit OT Evaluation $OT Eval Low Complexity: 1 Low G-Codes:     Edelmira Gallogly MSOT, OTR/L Acute Rehab Pager: [phone] Office: [place] 05/12/2017, 9:26 AM

## 2017-05-12 NOTE — Discharge Summary (Signed)
Physician Discharge Summary  Patient ID: Daniel Villanueva MRN: [id number] DOB/AGE: 56-Jan-1962 56 y.o.  Admit date: 05/11/2017 Discharge date: 05/12/2017  Admission Diagnoses:Grade I degenerative spondylolisthesis L 45 with foraminal stenosis  Discharge Diagnoses: Grade I degenerative spondylolisthesis L 45 with foraminal stenosis  Active Problems:   Degenerative spondylolisthesis   Discharged Condition: good  Hospital Course: Patient underwent decompression and fusion L 45 and did well.  He was discgharged home on am of POD 1.  Consults: None  Significant Diagnostic Studies: None  Treatments: surgery: Patient underwent decompression and fusion L 45  Discharge Exam: Blood pressure (!) 135/93, pulse 99, temperature 98.1 F (36.7 C), temperature source Oral, resp. rate 18, height 6\' 2"  (1.88 m), weight 124.7 kg (275 lb), SpO2 97 %. Neurologic: Alert and oriented X 3, normal strength and tone. Normal symmetric reflexes. Normal coordination and gait Wound:CDI  Disposition: Home  Discharge Instructions    Diet - low sodium heart healthy   Complete by:  As directed    Increase activity slowly   Complete by:  As directed      Allergies as of 05/12/2017      Reactions   Adhesive [tape] Dermatitis   Skin irritation       Medication List    STOP taking these medications   ibuprofen 200 MG tablet Commonly known as:  ADVIL,MOTRIN     TAKE these medications   atorvastatin 40 MG tablet Commonly known as:  LIPITOR Take 40 mg by mouth every morning.   diazepam 5 MG tablet Commonly known as:  VALIUM Take 1-2 tablets (5-10 mg total) by mouth every 6 (six) hours as needed for muscle spasms.   divalproex 250 MG DR tablet Commonly known as:  DEPAKOTE Take 1 tablet (250 mg total) by mouth every 12 (twelve) hours. What changed:    how much to take  when to take this   doxycycline 100 MG tablet Commonly known as:  VIBRA-TABS Take 100 mg by mouth 2 (two) times daily.    fluticasone 50 MCG/ACT nasal spray Commonly known as:  FLONASE Place 1 spray into both nostrils daily.   lisinopril-hydrochlorothiazide 20-25 MG tablet Commonly known as:  PRINZIDE,ZESTORETIC Take 1 tablet by mouth daily.   omeprazole 20 MG capsule Commonly known as:  PRILOSEC Take 20 mg by mouth every morning.   Oxycodone HCl 10 MG Tabs Take 0.5-1 tablets (5-10 mg total) by mouth every 6 (six) hours as needed for severe pain ((score 7 to 10)).   psyllium 58.6 % powder Commonly known as:  METAMUCIL Take 1 packet by mouth every evening.   topiramate 25 MG tablet Commonly known as:  TOPAMAX Take 50 mg by mouth 2 (two) times daily.   traMADol 50 MG tablet Commonly known as:  ULTRAM Take 25-50 mg by mouth every 4 (four) hours as needed for moderate pain.   zolpidem 5 MG tablet Commonly known as:  AMBIEN Take 5-10 mg by mouth at bedtime as needed for sleep.            Durable Medical Equipment  (From admission, onward)        Start     Ordered   05/11/17 1241  DME Walker rolling  Once    Question:  Patient needs a walker to treat with the following condition  Answer:  Degenerative spondylolisthesis   05/11/17 1240   05/11/17 1241  DME 3 n 1  Once     05/11/17 1240  Signed: Dorian HeckleSTERN,Jaicion Laurie D, MD 05/12/2017, 7:02 AM

## 2017-05-12 NOTE — Progress Notes (Signed)
Subjective: Patient reports doing well.  Sore in back.  Objective: Vital signs in last 24 hours: Temp:  [97.5 F (36.4 C)-98.6 F (37 C)] 98.1 F (36.7 C) (01/05 0320) Pulse Rate:  [72-102] 99 (01/05 0320) Resp:  [14-23] 18 (01/05 0320) BP: (80-139)/(62-96) 135/93 (01/05 0320) SpO2:  [94 %-99 %] 97 % (01/05 0320) Weight:  [124.7 kg (275 lb)] 124.7 kg (275 lb) (01/04 0704)  Intake/Output from previous day: 01/04 0701 - 01/05 0700 In: 2290 [P.O.:240; I.V.:2000; IV Piggyback:50] Out: 545 [Urine:345; Blood:200] Intake/Output this shift: No intake/output data recorded.  Physical Exam: Strength full both legs.  Right leg numbness improved from preop.  Dressing CDI.  Lab Results: No results for input(s): WBC, HGB, HCT, PLT in the last 72 hours. BMET No results for input(s): NA, K, CL, CO2, GLUCOSE, BUN, CREATININE, CALCIUM in the last 72 hours.  Studies/Results: Dg Lumbar Spine 2-3 Views  Result Date: 05/11/2017 CLINICAL DATA:  Surgery: L4-5 PLIF EXAM: DG C-ARM 61-120 MIN; LUMBAR SPINE - 2-3 VIEW COMPARISON:  None. FINDINGS: AP and lateral intraoperative fluoroscopic images are provided. Fixation screws appear appropriately positioned at the L4 and L5 levels, with intervening disc spacers/cage. Fluoroscopy was provided for 51 seconds. IMPRESSION: Intraoperative images demonstrating L4-5 fixation. No evidence of surgical complicating feature. Electronically Signed   By: Bary RichardStan  Maynard M.D.   On: 05/11/2017 11:14   Dg C-arm 1-60 Min  Result Date: 05/11/2017 CLINICAL DATA:  Surgery: L4-5 PLIF EXAM: DG C-ARM 61-120 MIN; LUMBAR SPINE - 2-3 VIEW COMPARISON:  None. FINDINGS: AP and lateral intraoperative fluoroscopic images are provided. Fixation screws appear appropriately positioned at the L4 and L5 levels, with intervening disc spacers/cage. Fluoroscopy was provided for 51 seconds. IMPRESSION: Intraoperative images demonstrating L4-5 fixation. No evidence of surgical complicating feature.  Electronically Signed   By: Bary RichardStan  Maynard M.D.   On: 05/11/2017 11:14    Assessment/Plan: Doing well.  Discharge home.    LOS: 1 day    Annice Jolly D, MD 05/12/2017, 7:01 AM

## 2017-05-14 ENCOUNTER — Inpatient Hospital Stay (HOSPITAL_COMMUNITY)
Admission: EM | Admit: 2017-05-14 | Discharge: 2017-05-16 | DRG: 948 | Disposition: A | Discharge status: Home / Self Care | Payer: Managed Care, Other (non HMO) | Attending: Neurosurgery | Admitting: Neurosurgery

## 2017-05-14 ENCOUNTER — Emergency Department (HOSPITAL_COMMUNITY): Payer: Managed Care, Other (non HMO)

## 2017-05-14 ENCOUNTER — Encounter (HOSPITAL_COMMUNITY): Payer: Self-pay | Admitting: Emergency Medicine

## 2017-05-14 ENCOUNTER — Other Ambulatory Visit: Payer: Self-pay

## 2017-05-14 DIAGNOSIS — E7800 Pure hypercholesterolemia, unspecified: Secondary | ICD-10-CM | POA: Diagnosis present

## 2017-05-14 DIAGNOSIS — Z91048 Other nonmedicinal substance allergy status: Secondary | ICD-10-CM

## 2017-05-14 DIAGNOSIS — K219 Gastro-esophageal reflux disease without esophagitis: Secondary | ICD-10-CM | POA: Diagnosis present

## 2017-05-14 DIAGNOSIS — G8918 Other acute postprocedural pain: Principal | ICD-10-CM | POA: Diagnosis present

## 2017-05-14 DIAGNOSIS — I10 Essential (primary) hypertension: Secondary | ICD-10-CM | POA: Diagnosis present

## 2017-05-14 DIAGNOSIS — R338 Other retention of urine: Secondary | ICD-10-CM | POA: Diagnosis present

## 2017-05-14 DIAGNOSIS — Z87891 Personal history of nicotine dependence: Secondary | ICD-10-CM

## 2017-05-14 DIAGNOSIS — K5903 Drug induced constipation: Secondary | ICD-10-CM | POA: Diagnosis present

## 2017-05-14 DIAGNOSIS — M545 Low back pain: Secondary | ICD-10-CM | POA: Diagnosis present

## 2017-05-14 DIAGNOSIS — R339 Retention of urine, unspecified: Secondary | ICD-10-CM

## 2017-05-14 DIAGNOSIS — Z981 Arthrodesis status: Secondary | ICD-10-CM

## 2017-05-14 DIAGNOSIS — N401 Enlarged prostate with lower urinary tract symptoms: Secondary | ICD-10-CM | POA: Diagnosis present

## 2017-05-14 LAB — URINALYSIS, ROUTINE W REFLEX MICROSCOPIC
Bilirubin Urine: NEGATIVE
GLUCOSE, UA: NEGATIVE mg/dL
HGB URINE DIPSTICK: NEGATIVE
Ketones, ur: NEGATIVE mg/dL
LEUKOCYTES UA: NEGATIVE
Nitrite: NEGATIVE
PH: 6 (ref 5.0–8.0)
Protein, ur: NEGATIVE mg/dL
Specific Gravity, Urine: 1.011 (ref 1.005–1.030)

## 2017-05-14 LAB — CBC WITH DIFFERENTIAL/PLATELET
Basophils Absolute: 0.1 10*3/uL (ref 0.0–0.1)
Basophils Relative: 1 %
EOS PCT: 2 %
Eosinophils Absolute: 0.1 10*3/uL (ref 0.0–0.7)
HEMATOCRIT: 40.8 % (ref 39.0–52.0)
Hemoglobin: 14.4 g/dL (ref 13.0–17.0)
LYMPHS PCT: 32 %
Lymphs Abs: 2.6 10*3/uL (ref 0.7–4.0)
MCH: 29.1 pg (ref 26.0–34.0)
MCHC: 35.3 g/dL (ref 30.0–36.0)
MCV: 82.4 fL (ref 78.0–100.0)
MONO ABS: 0.8 10*3/uL (ref 0.1–1.0)
MONOS PCT: 10 %
NEUTROS ABS: 4.6 10*3/uL (ref 1.7–7.7)
Neutrophils Relative %: 55 %
Platelets: 196 10*3/uL (ref 150–400)
RBC: 4.95 MIL/uL (ref 4.22–5.81)
RDW: 12.7 % (ref 11.5–15.5)
WBC: 8.2 10*3/uL (ref 4.0–10.5)

## 2017-05-14 LAB — BASIC METABOLIC PANEL
ANION GAP: 11 (ref 5–15)
BUN: 8 mg/dL (ref 6–20)
CO2: 28 mmol/L (ref 22–32)
CREATININE: 0.73 mg/dL (ref 0.61–1.24)
Calcium: 8.6 mg/dL — ABNORMAL LOW (ref 8.9–10.3)
Chloride: 86 mmol/L — ABNORMAL LOW (ref 101–111)
GFR calc Af Amer: >60 mL/min (ref >60)
GFR calc non Af Amer: >60 mL/min (ref >60)
GLUCOSE: 104 mg/dL — AB (ref 65–99)
Potassium: 3.4 mmol/L — ABNORMAL LOW (ref 3.5–5.1)
Sodium: 125 mmol/L — ABNORMAL LOW (ref 135–145)

## 2017-05-14 MED ORDER — ADULT MULTIVITAMIN W/MINERALS CH
1.0000 | ORAL_TABLET | Freq: Every day | ORAL | Status: DC
  Administered 2017-05-14 – 2017-05-16 (×3): 1 via ORAL
  Filled 2017-05-14 (×3): qty 1

## 2017-05-14 MED ORDER — ONDANSETRON HCL 4 MG/2ML IJ SOLN
4.0000 mg | Freq: Four times a day (QID) | INTRAMUSCULAR | Status: DC | PRN
  Administered 2017-05-14: 4 mg via INTRAVENOUS
  Filled 2017-05-14: qty 2

## 2017-05-14 MED ORDER — KETOROLAC TROMETHAMINE 30 MG/ML IJ SOLN
30.0000 mg | Freq: Four times a day (QID) | INTRAMUSCULAR | Status: DC
  Administered 2017-05-14 – 2017-05-16 (×8): 30 mg via INTRAVENOUS
  Filled 2017-05-14 (×8): qty 1

## 2017-05-14 MED ORDER — SENNA 8.6 MG PO TABS
1.0000 | ORAL_TABLET | Freq: Two times a day (BID) | ORAL | Status: DC
  Administered 2017-05-14 – 2017-05-16 (×4): 8.6 mg via ORAL
  Filled 2017-05-14 (×5): qty 1

## 2017-05-14 MED ORDER — MORPHINE SULFATE (PF) 4 MG/ML IV SOLN
4.0000 mg | Freq: Once | INTRAVENOUS | Status: AC
  Administered 2017-05-14: 4 mg via INTRAVENOUS
  Filled 2017-05-14: qty 1

## 2017-05-14 MED ORDER — SODIUM CHLORIDE 0.9 % IV BOLUS (SEPSIS)
1000.0000 mL | Freq: Once | INTRAVENOUS | Status: AC
  Administered 2017-05-14: 1000 mL via INTRAVENOUS

## 2017-05-14 MED ORDER — TAMSULOSIN HCL 0.4 MG PO CAPS
0.8000 mg | ORAL_CAPSULE | Freq: Every day | ORAL | Status: DC
  Administered 2017-05-14 – 2017-05-15 (×2): 0.8 mg via ORAL
  Filled 2017-05-14 (×2): qty 2

## 2017-05-14 MED ORDER — OXYCODONE HCL 5 MG PO TABS
5.0000 mg | ORAL_TABLET | ORAL | Status: DC | PRN
  Administered 2017-05-15 (×2): 10 mg via ORAL
  Administered 2017-05-16: 5 mg via ORAL
  Administered 2017-05-16: 10 mg via ORAL
  Filled 2017-05-14 (×3): qty 2
  Filled 2017-05-14: qty 1

## 2017-05-14 MED ORDER — FLEET ENEMA 7-19 GM/118ML RE ENEM
1.0000 | ENEMA | Freq: Once | RECTAL | Status: AC
  Administered 2017-05-14: 1 via RECTAL
  Filled 2017-05-14: qty 1

## 2017-05-14 MED ORDER — ONDANSETRON HCL 4 MG PO TABS: 4.0000 mg | ORAL_TABLET | Freq: Four times a day (QID) | ORAL | Status: DC | PRN

## 2017-05-14 MED ORDER — ACETAMINOPHEN 325 MG PO TABS: 650.0000 mg | ORAL_TABLET | Freq: Four times a day (QID) | ORAL | Status: DC | PRN

## 2017-05-14 MED ORDER — HYDROMORPHONE HCL 1 MG/ML IJ SOLN
1.0000 mg | INTRAMUSCULAR | Status: DC | PRN
  Administered 2017-05-14 – 2017-05-16 (×7): 1 mg via INTRAVENOUS
  Filled 2017-05-14 (×7): qty 1

## 2017-05-14 MED ORDER — POTASSIUM CHLORIDE IN NACL 20-0.9 MEQ/L-% IV SOLN
INTRAVENOUS | Status: DC
  Administered 2017-05-14 – 2017-05-15 (×2): via INTRAVENOUS
  Filled 2017-05-14 (×3): qty 1000

## 2017-05-14 MED ORDER — POLYETHYLENE GLYCOL 3350 17 G PO PACK: 17.0000 g | PACK | Freq: Every day | ORAL | Status: DC | PRN

## 2017-05-14 MED ORDER — HYDROMORPHONE HCL 1 MG/ML IJ SOLN
1.0000 mg | Freq: Once | INTRAMUSCULAR | Status: AC
  Administered 2017-05-14: 1 mg via INTRAVENOUS
  Filled 2017-05-14: qty 1

## 2017-05-14 MED ORDER — ACETAMINOPHEN 650 MG RE SUPP: 650.0000 mg | Freq: Four times a day (QID) | RECTAL | Status: DC | PRN

## 2017-05-14 MED ORDER — BISACODYL 10 MG RE SUPP: 10.0000 mg | Freq: Every day | RECTAL | Status: DC | PRN

## 2017-05-14 MED ORDER — DEXAMETHASONE SODIUM PHOSPHATE 10 MG/ML IJ SOLN
10.0000 mg | Freq: Four times a day (QID) | INTRAMUSCULAR | Status: DC
  Administered 2017-05-14 – 2017-05-16 (×8): 10 mg via INTRAVENOUS
  Filled 2017-05-14 (×8): qty 1

## 2017-05-14 NOTE — ED Provider Notes (Addendum)
[facility]   CSN: [id number] Arrival date & time: 05/14/17  [id number]     History   Chief Complaint Chief Complaint  Patient presents with  . Back Pain  . Urinary Retention    HPI Cyndi Benderaul C Longino is a 57 y.o. male.  HPI Pt had back surgery on Friday.  He has not been able to have a bowel movement or urinated properly since then.  He has tried metamucil and sennokot without relief.  He also tried some magnesium citrate as well as glycerin suppository.  He has only had a little bit of liquid stool.  He also feels like he has not been able to empty his bladder properly. Past Medical History:  Diagnosis Date  . Arthritis    back, neck &hands   . GERD (gastroesophageal reflux disease)   . Headache    controlled on Depakote  . High cholesterol   . History of hiatal hernia   . Hypertension   . Spondylolisthesis of lumbar region     Patient Active Problem List   Diagnosis Date Noted  . Degenerative spondylolisthesis 05/11/2017  . MDD (major depressive disorder), single episode, severe , no psychosis (HCC) 08/16/2015    Past Surgical History:  Procedure Laterality Date  . EYE SURGERY     bot eyes- cataracts removed- /w IOL  . HEMORROIDECTOMY  1988  . HERNIA REPAIR  1998   umbilical hernia   . TONSILLECTOMY         Home Medications    Prior to Admission medications   Medication Sig Start Date End Date Taking? Authorizing Provider  atorvastatin (LIPITOR) 40 MG tablet Take 40 mg by mouth every morning. 08/05/15  Yes [provider]  diazepam (VALIUM) 5 MG tablet Take 1-2 tablets (5-10 mg total) by mouth every 6 (six) hours as needed for muscle spasms. 05/12/17  Yes Maeola HarmanStern, Joseph, MD  divalproex (DEPAKOTE) 250 MG DR tablet Take 1 tablet (250 mg total) by mouth every 12 (twelve) hours. Patient taking differently: Take 500 mg by mouth daily.  08/16/15  Yes Loren RacerYelverton, David, MD  fluticasone (FLONASE) 50 MCG/ACT nasal  spray Place 1 spray into both nostrils daily. 05/19/15 05/14/17 Yes [provider]  glycerin adult 2 g suppository Place 1 suppository rectally daily as needed for constipation.   Yes [provider]  lisinopril-hydrochlorothiazide (PRINZIDE,ZESTORETIC) 20-25 MG tablet Take 1 tablet by mouth daily. 06/28/15  Yes [provider]  magnesium citrate SOLN Take 1 Bottle by mouth once as needed for mild constipation or severe constipation.   Yes [provider]  omeprazole (PRILOSEC) 20 MG capsule Take 20 mg by mouth every morning.   Yes [provider]  oxyCODONE 10 MG TABS Take 0.5-1 tablets (5-10 mg total) by mouth every 6 (six) hours as needed for severe pain ((score 7 to 10)). 05/12/17  Yes Maeola HarmanStern, Joseph, MD  psyllium (METAMUCIL) 58.6 % powder Take 1 packet by mouth every evening.   Yes [provider]  senna (SENOKOT) 8.6 MG TABS tablet Take 1 tablet by mouth daily as needed for mild constipation.   Yes [provider]  topiramate (TOPAMAX) 25 MG tablet Take 50 mg by mouth 2 (two) times daily. 07/22/15  Yes [provider]  traMADol (ULTRAM) 50 MG tablet Take 25-50 mg by mouth every 4 (four) hours as needed for moderate pain.  07/29/15  Yes [provider]  zolpidem (AMBIEN) 5 MG tablet Take 5-10 mg  by mouth at bedtime as needed for sleep. 04/21/17  Yes [provider]    Family History No family history on file.  Social History Social History   Tobacco Use  . Smoking status: Former Smoker    Years: 10.00    Last attempt to quit: 05/02/1988    Years since quitting: 29.0  . Smokeless tobacco: Never Used  Substance Use Topics  . Alcohol use: Yes    Comment: couple times per week, BEER  . Drug use: No     Allergies   Adhesive [tape]   Review of Systems Review of Systems  All other systems reviewed and are negative.    Physical Exam Updated Vital Signs BP 131/79 (BP Location: Right Arm)   Pulse  96   Temp 98.7 F (37.1 C) (Oral)   Resp 16   SpO2 100%   Physical Exam  Constitutional: He appears well-developed and well-nourished. No distress.  HENT:  Head: Normocephalic and atraumatic.  Right Ear: External ear normal.  Left Ear: External ear normal.  Eyes: Conjunctivae are normal. Right eye exhibits no discharge. Left eye exhibits no discharge. No scleral icterus.  Neck: Neck supple. No tracheal deviation present.  Cardiovascular: Normal rate, regular rhythm and intact distal pulses.  Pulmonary/Chest: Effort normal and breath sounds normal. No stridor. No respiratory distress. He has no wheezes. He has no rales.  Abdominal: Soft. Bowel sounds are normal. He exhibits no distension. There is no tenderness. There is no rebound and no guarding.  Musculoskeletal: He exhibits no edema or tenderness.  Neurological: He is alert. He has normal strength. No cranial nerve deficit (no facial droop, extraocular movements intact, no slurred speech) or sensory deficit. He exhibits normal muscle tone. He displays no seizure activity. Coordination normal.  Skin: Skin is warm and dry. No rash noted.  Psychiatric: He has a normal mood and affect.  Nursing note and vitals reviewed.    ED Treatments / Results  Labs (all labs ordered are listed, but only abnormal results are displayed) Labs Reviewed  BASIC METABOLIC PANEL - Abnormal; Notable for the following components:      Result Value   Sodium 125 (*)    Potassium 3.4 (*)    Chloride 86 (*)    Glucose, Bld 104 (*)    Calcium 8.6 (*)    All other components within normal limits  CBC WITH DIFFERENTIAL/PLATELET  URINALYSIS, ROUTINE W REFLEX MICROSCOPIC     Radiology Dg Abdomen 1 View  Result Date: 05/14/2017 CLINICAL DATA:  Constipation EXAM: ABDOMEN - 1 VIEW COMPARISON:  None. FINDINGS: No dilated small bowel loops. Moderate colonic stool volume. No evidence of pneumatosis or pneumoperitoneum. Surgical fusion hardware is noted in the  lower lumbar spine. No radiopaque nephrolithiasis. Undigested pill fragments overlie the right abdomen. IMPRESSION: Nonobstructive bowel gas pattern. Moderate colonic stool volume, which may indicate constipation. Electronically Signed   By: Delbert Phenix M.D.   On: 05/14/2017 11:21    Procedures Procedures (including critical care time)  Medications Ordered in ED Medications  sodium chloride 0.9 % bolus 1,000 mL (not administered)  HYDROmorphone (DILAUDID) injection 1 mg (not administered)  sodium phosphate (FLEET) 7-19 GM/118ML enema 1 enema (1 enema Rectal Given 05/14/17 1144)  morphine 4 MG/ML injection 4 mg (4 mg Intravenous Given 05/14/17 1144)     Initial Impression / Assessment and Plan / ED Course  I have reviewed the triage vital signs and the nursing notes.  Pertinent labs & imaging results that  were available during my care of the patient were reviewed by me and considered in my medical decision making (see chart for details).  Clinical Course as of May 15 1311  Mon May 14, 2017  1256 New hyponatremia noted.  CBC is stable.   Xray shows constipation  [JK]  1259 Foley was placed, greater than 1000 cc of urine  [JK]  1312 Pt is not improving despite treatment.  He has urinary retention and constipation.  I spoke with Dr Jordan Likes regarding admission.  He will see the patient in the ED.  [JK]    Clinical Course User Index [JK] Linwood Dibbles, MD    Patient presents to emergency room with complaints of severe pain in his lower back associated with constipation and urinary retention.  Patient is admitted to the hospital on January 4 and discharged on January 5.  He returns because severe persistent lower back pain.  Patient was treated with pain medications including morphine and Dilaudid but he continues to still have a severe amount of pain.  He had a Foley catheter placed for urinary retention.  He also continues to be constipated.  I did order an enema but improvement.  I will consult with  his surgeon.  I think he will need to be admitted for symptomatic treatment.  Final Clinical Impressions(s) / ED Diagnoses   Final diagnoses:  Post-op pain  Urinary retention  Drug-induced constipation      Linwood Dibbles, MD 05/14/17 1302    Linwood Dibbles, MD 05/14/17 1313

## 2017-05-14 NOTE — ED Notes (Signed)
[facility]

## 2017-05-14 NOTE — ED Notes (Addendum)
Patient states he had surgery on his back Fri. Was discharged home on Sat. States he had his foley removed on Sat hasn't been able to urine normally since. States he will just pass a small amt. At  A time. Wife states she talked to the on call Dr. For Dr. Jordan LikesPool yest and was told to double up his pain medicines. States he took 2 oxycodone  And 2 valium this am at 4 . Wife states patient is sleeping only at very short intervals. Patient is very drowsy presently. Wife states patient hasn't had a bowel movement since Thurs.; used otc meds no relief

## 2017-05-14 NOTE — ED Notes (Signed)
Last Bm prior to enema today was Thursday before surgery

## 2017-05-14 NOTE — ED Triage Notes (Signed)
Pt had lumbar fusion on Friday and now having trouble getting pain under control. Pt is snoring in triage and has to be awaken to answer questions. Pt states yesterday they he talked to Surgeons office and was told to increase pain medication. Pt is taking oxycodone and valium last dose 4am. Pt states he is also having trouble will urinating, pt just urinated in lobby but states he can only go a small amount at a time.

## 2017-05-14 NOTE — H&P (Signed)
Daniel Villanueva is an 57 y.o. male.   Chief Complaint: Back pain HPI: The patient is a 57 year old male recently status post L4-5 decompression and fusion surgery.  Initially the patient's pain was much improved but he has had difficulty with increasing back pain over the last 36 hours.  He is having some difficulty with urinary retention and constipation.  He has no sacral anesthesia.  He has no loss of sensation into either lower extremity.  He has normal motor strength.  Pain is making it difficult for him to manage at home.  Past Medical History:  Diagnosis Date  . Arthritis    back, neck &hands   . GERD (gastroesophageal reflux disease)   . Headache    controlled on Depakote  . High cholesterol   . History of hiatal hernia   . Hypertension   . Spondylolisthesis of lumbar region     Past Surgical History:  Procedure Laterality Date  . EYE SURGERY     bot eyes- cataracts removed- /w IOL  . HEMORROIDECTOMY  1988  . HERNIA REPAIR  1884   umbilical hernia   . TONSILLECTOMY      No family history on file. Social History:  reports that he quit smoking about 29 years ago. He quit after 10.00 years of use. he has never used smokeless tobacco. He reports that he drinks alcohol. He reports that he does not use drugs.  Allergies:  Allergies  Allergen Reactions  . Adhesive [Tape] Dermatitis    Skin irritation      (Not in a hospital admission)  Results for orders placed or performed during the hospital encounter of 05/14/17 (from the past 48 hour(s))  CBC with Differential     Status: None   Collection Time: 05/14/17 12:05 PM  Result Value Ref Range   WBC 8.2 4.0 - 10.5 K/uL   RBC 4.95 4.22 - 5.81 MIL/uL   Hemoglobin 14.4 13.0 - 17.0 g/dL   HCT 40.8 39.0 - 52.0 %   MCV 82.4 78.0 - 100.0 fL   MCH 29.1 26.0 - 34.0 pg   MCHC 35.3 30.0 - 36.0 g/dL   RDW 12.7 11.5 - 15.5 %   Platelets 196 150 - 400 K/uL   Neutrophils Relative % 55 %   Neutro Abs 4.6 1.7 - 7.7 K/uL   Lymphocytes Relative 32 %   Lymphs Abs 2.6 0.7 - 4.0 K/uL   Monocytes Relative 10 %   Monocytes Absolute 0.8 0.1 - 1.0 K/uL   Eosinophils Relative 2 %   Eosinophils Absolute 0.1 0.0 - 0.7 K/uL   Basophils Relative 1 %   Basophils Absolute 0.1 0.0 - 0.1 K/uL  Basic metabolic panel     Status: Abnormal   Collection Time: 05/14/17 12:05 PM  Result Value Ref Range   Sodium 125 (L) 135 - 145 mmol/L   Potassium 3.4 (L) 3.5 - 5.1 mmol/L   Chloride 86 (L) 101 - 111 mmol/L   CO2 28 22 - 32 mmol/L   Glucose, Bld 104 (H) 65 - 99 mg/dL   BUN 8 6 - 20 mg/dL   Creatinine, Ser 0.73 0.61 - 1.24 mg/dL   Calcium 8.6 (L) 8.9 - 10.3 mg/dL   GFR calc non Af Amer >60 >60 mL/min   GFR calc Af Amer >60 >60 mL/min    Comment: (NOTE) The eGFR has been calculated using the CKD EPI equation. This calculation has not been validated in all clinical situations. eGFR's persistently <60  mL/min signify possible Chronic Kidney Disease.    Anion gap 11 5 - 15  Urinalysis, Routine w reflex microscopic     Status: None   Collection Time: 05/14/17 12:58 PM  Result Value Ref Range   Color, Urine YELLOW YELLOW   APPearance CLEAR CLEAR   Specific Gravity, Urine 1.011 1.005 - 1.030   pH 6.0 5.0 - 8.0   Glucose, UA NEGATIVE NEGATIVE mg/dL   Hgb urine dipstick NEGATIVE NEGATIVE   Bilirubin Urine NEGATIVE NEGATIVE   Ketones, ur NEGATIVE NEGATIVE mg/dL   Protein, ur NEGATIVE NEGATIVE mg/dL   Nitrite NEGATIVE NEGATIVE   Leukocytes, UA NEGATIVE NEGATIVE   Dg Abdomen 1 View  Result Date: 05/14/2017 CLINICAL DATA:  Constipation EXAM: ABDOMEN - 1 VIEW COMPARISON:  None. FINDINGS: No dilated small bowel loops. Moderate colonic stool volume. No evidence of pneumatosis or pneumoperitoneum. Surgical fusion hardware is noted in the lower lumbar spine. No radiopaque nephrolithiasis. Undigested pill fragments overlie the right abdomen. IMPRESSION: Nonobstructive bowel gas pattern. Moderate colonic stool volume, which may indicate  constipation. Electronically Signed   By: Ilona Sorrel M.D.   On: 05/14/2017 11:21    Pertinent items noted in HPI and remainder of comprehensive ROS otherwise negative.  Blood pressure 108/61, pulse 93, temperature 98.7 F (37.1 C), temperature source Oral, resp. rate 16, SpO2 96 %.   The patient is awake and alert.  He is oriented and appropriate.  His speech is fluent.  His judgment and insight are intact.  He obviously has a fair amount of narcotic on board.  Cranial nerve function normal bilaterally.  Motor examination intact bilaterally.  Patient has some pain with straight leg raising but mostly the pain is within his back and is not radicular.  He has normal sensation in both lower extremities.  His wound is clean and dry.  His chest and abdomen are benign.   Assessment/Plan: The patient is having difficulty with postoperative pain control with some accompanying urinary retention which I think is likely secondary to pain and prostatism.  Plan for admission for pain control, mobilization and treatment of urinary retention.  nHenry A Masashi Snowdon 05/14/2017, 2:20 PM

## 2017-05-14 NOTE — ED Notes (Signed)
Bladder scan 600CC

## 2017-05-15 DIAGNOSIS — G8918 Other acute postprocedural pain: Secondary | ICD-10-CM | POA: Diagnosis present

## 2017-05-15 DIAGNOSIS — Z981 Arthrodesis status: Secondary | ICD-10-CM | POA: Diagnosis not present

## 2017-05-15 DIAGNOSIS — K219 Gastro-esophageal reflux disease without esophagitis: Secondary | ICD-10-CM | POA: Diagnosis present

## 2017-05-15 DIAGNOSIS — Z91048 Other nonmedicinal substance allergy status: Secondary | ICD-10-CM | POA: Diagnosis not present

## 2017-05-15 DIAGNOSIS — I10 Essential (primary) hypertension: Secondary | ICD-10-CM | POA: Diagnosis present

## 2017-05-15 DIAGNOSIS — K5903 Drug induced constipation: Secondary | ICD-10-CM | POA: Diagnosis present

## 2017-05-15 DIAGNOSIS — M545 Low back pain: Secondary | ICD-10-CM | POA: Diagnosis present

## 2017-05-15 DIAGNOSIS — Z87891 Personal history of nicotine dependence: Secondary | ICD-10-CM | POA: Diagnosis not present

## 2017-05-15 DIAGNOSIS — R339 Retention of urine, unspecified: Secondary | ICD-10-CM | POA: Diagnosis present

## 2017-05-15 DIAGNOSIS — N401 Enlarged prostate with lower urinary tract symptoms: Secondary | ICD-10-CM | POA: Diagnosis present

## 2017-05-15 DIAGNOSIS — R338 Other retention of urine: Secondary | ICD-10-CM | POA: Diagnosis present

## 2017-05-15 DIAGNOSIS — E7800 Pure hypercholesterolemia, unspecified: Secondary | ICD-10-CM | POA: Diagnosis present

## 2017-05-15 LAB — BASIC METABOLIC PANEL
Anion gap: 8 (ref 5–15)
BUN: 12 mg/dL (ref 6–20)
CHLORIDE: 95 mmol/L — AB (ref 101–111)
CO2: 26 mmol/L (ref 22–32)
CREATININE: 0.75 mg/dL (ref 0.61–1.24)
Calcium: 8.2 mg/dL — ABNORMAL LOW (ref 8.9–10.3)
GFR calc Af Amer: >60 mL/min (ref >60)
GFR calc non Af Amer: >60 mL/min (ref >60)
GLUCOSE: 172 mg/dL — AB (ref 65–99)
POTASSIUM: 5.2 mmol/L — AB (ref 3.5–5.1)
SODIUM: 129 mmol/L — AB (ref 135–145)

## 2017-05-15 LAB — HIV ANTIBODY (ROUTINE TESTING W REFLEX): HIV Screen 4th Generation wRfx: NONREACTIVE

## 2017-05-15 MED ORDER — METHOCARBAMOL 1000 MG/10ML IJ SOLN
500.0000 mg | Freq: Four times a day (QID) | INTRAVENOUS | Status: DC | PRN
  Administered 2017-05-15: 500 mg via INTRAVENOUS
  Filled 2017-05-15 (×2): qty 5

## 2017-05-15 MED ORDER — DIAZEPAM 5 MG PO TABS
5.0000 mg | ORAL_TABLET | Freq: Four times a day (QID) | ORAL | Status: DC | PRN
  Administered 2017-05-15: 10 mg via ORAL
  Administered 2017-05-16: 5 mg via ORAL
  Filled 2017-05-15: qty 1
  Filled 2017-05-15: qty 2

## 2017-05-15 MED ORDER — SODIUM CHLORIDE 0.9 % IV SOLN
INTRAVENOUS | Status: DC
Start: 2017-05-15 — End: 2017-05-16
  Administered 2017-05-15 (×2): via INTRAVENOUS

## 2017-05-15 NOTE — Progress Notes (Signed)
Patient c/o and waking up with severe R leg cramps.  Notified MD.

## 2017-05-15 NOTE — Evaluation (Signed)
Physical Therapy Evaluation Patient Details Name: Daniel Villanueva C Maranto MRN: [id number] DOB: 1960/10/07 Today's Date: 05/15/2017   History of Present Illness  The patient is a 57 year old male recently status post L4-5 decompression and fusion surgery  on 1/5. Initially the patient's pain was much improved but he has had difficulty with increasing back pain over the last 36 hours. He is having some difficulty with urinary retention and constipation. Admtited for back pain  Clinical Impression  Pt admitted for above. Pt re-educated on back precautions and is functioning at supervision level without AD for ambulation. Pt here for pain management. PT to monitor patient while he is here.    Follow Up Recommendations No PT follow up;Supervision - Intermittent    Equipment Recommendations  None recommended by PT    Recommendations for Other Services       Precautions / Restrictions Precautions Precautions: Back Precaution Booklet Issued: No Precaution Comments: reviewed and re-educated pt on back precautions BLT Required Braces or Orthoses: Spinal Brace Spinal Brace: Thoracolumbosacral orthotic;Applied in sitting position Restrictions Weight Bearing Restrictions: No      Mobility  Bed Mobility               General bed mobility comments: pt up walking in hallway with RN upon PT arrival  Transfers Overall transfer level: Needs assistance Equipment used: None Transfers: Sit to/from Stand Sit to Stand: Supervision         General transfer comment: increased time, bed elevated to mimic bed height at home, pt with elevated toliet surface as well  Ambulation/Gait Ambulation/Gait assistance: Supervision Ambulation Distance (Feet): 200 Feet Assistive device: None Gait Pattern/deviations: Step-through pattern;Decreased stride length;Wide base of support Gait velocity: decreased Gait velocity interpretation: Below normal speed for age/gender General Gait Details: guarded, pt able  to maintain upright/optimal erect back posture without AD, pt with increased trunk flex with RW. Pt steady with no episodes of LOB or increased pain during ambulation without AD.  Stairs            Wheelchair Mobility    Modified Rankin (Stroke Patients Only)       Balance Overall balance assessment: Needs assistance Sitting-balance support: No upper extremity supported Sitting balance-Leahy Scale: Good     Standing balance support: No upper extremity supported Standing balance-Leahy Scale: Fair                               Pertinent Vitals/Pain Pain Assessment: 0-10 Pain Score: 6  Pain Location: back Pain Descriptors / Indicators: Aching Pain Intervention(s): Monitored during session    Home Living Family/patient expects to be discharged to:: Private residence Living Arrangements: Spouse/significant other Available Help at Discharge: Family;Available 24 hours/day Type of Home: House Home Access: Stairs to enter Entrance Stairs-Rails: None Entrance Stairs-Number of Steps: 2 Home Layout: One level Home Equipment: Hand held shower head;Toilet riser      Prior Function Level of Independence: Independent         Comments: pt wasn't using an AD for ambulation but was limited functionally by pain since surgery     Hand Dominance   Dominant Hand: Right    Extremity/Trunk Assessment   Upper Extremity Assessment Upper Extremity Assessment: Overall WFL for tasks assessed    Lower Extremity Assessment Lower Extremity Assessment: Generalized weakness(from surgery, onset of low back pain with MMT)    Cervical / Trunk Assessment Cervical / Trunk Assessment: Other exceptions Cervical / Trunk Exceptions:  s/p PLIF   Communication   Communication: No difficulties  Cognition Arousal/Alertness: Awake/alert Behavior During Therapy: WFL for tasks assessed/performed Overall Cognitive Status: Within Functional Limits for tasks assessed                                         General Comments      Exercises     Assessment/Plan    PT Assessment Patient needs continued PT services  PT Problem List Decreased strength;Decreased mobility;Decreased balance;Decreased knowledge of use of DME;Decreased knowledge of precautions;Pain       PT Treatment Interventions Gait training;Stair training;Functional mobility training;Therapeutic activities;Therapeutic exercise;Balance training;Neuromuscular re-education    PT Goals (Current goals can be found in the Care Plan section)  Acute Rehab PT Goals Patient Stated Goal: go home today PT Goal Formulation: With patient Time For Goal Achievement: 05/29/17 Potential to Achieve Goals: Good    Frequency Min 3X/week   Barriers to discharge        Co-evaluation               AM-PAC PT "6 Clicks" Daily Activity  Outcome Measure Difficulty turning over in bed (including adjusting bedclothes, sheets and blankets)?: A Little Difficulty moving from lying on back to sitting on the side of the bed? : A Little Difficulty sitting down on and standing up from a chair with arms (e.g., wheelchair, bedside commode, etc,.)?: Unable Help needed moving to and from a bed to chair (including a wheelchair)?: A Little Help needed walking in hospital room?: A Little Help needed climbing 3-5 steps with a railing? : A Little 6 Click Score: 16    End of Session Equipment Utilized During Treatment: Back brace;Gait belt Activity Tolerance: Patient tolerated treatment well Patient left: in chair;with call bell/phone within reach;with family/visitor present Nurse Communication: Mobility status PT Visit Diagnosis: Pain;Difficulty in walking, not elsewhere classified (R26.2) Pain - part of body: (back)    Time: 7829-5621 PT Time Calculation (min) (ACUTE ONLY): 20 min   Charges:   PT Evaluation $PT Eval Low Complexity: 1 Low     PT G CodesLewis Shock, PT, DPT Pager #:  [phone] Office #: [phone]   Jamilynn Whitacre M Diamonte Stavely 05/15/2017, 8:00 AM

## 2017-05-15 NOTE — Progress Notes (Signed)
Looks better today.  Pain better controlled.  Denies any numbness or weakness.  Complains about the catheter in his bladder.  Afebrile.  Vitals are stable.  Sodium 129 up from 125.  Patient appears nontoxic.  He is afebrile.  His vitals are stable.  Motor and sensory function intact.  Abdomen soft.  Appears to be doing better.  We will take the catheter out today.  Mobilize.  Possible home in morning.

## 2017-05-15 NOTE — Progress Notes (Signed)
Patient has walk around the unit several times today and tolerated it well.

## 2017-05-16 MED ORDER — DIAZEPAM 5 MG PO TABS: 5.0000 mg | ORAL_TABLET | Freq: Four times a day (QID) | ORAL | 0 refills | Status: DC | PRN

## 2017-05-16 MED ORDER — OXYCODONE HCL 10 MG PO TABS: 10.0000 mg | ORAL_TABLET | ORAL | 0 refills | Status: DC | PRN

## 2017-05-16 MED ORDER — METHYLPREDNISOLONE 4 MG PO TBPK: ORAL_TABLET | ORAL | 0 refills | Status: DC

## 2017-05-16 MED ORDER — DIAZEPAM 5 MG PO TABS
5.0000 mg | ORAL_TABLET | Freq: Four times a day (QID) | ORAL | 0 refills | Status: DC | PRN
Start: 2017-05-16 — End: 2018-11-06

## 2017-05-16 NOTE — Plan of Care (Signed)
Independent in hallway, pain controlled

## 2017-05-16 NOTE — Discharge Summary (Signed)
Physician Discharge Summary  Patient ID: SAMEL BRUNA MRN: [id number] DOB/AGE: 1960/10/17 57 y.o.  Admit date: 05/14/2017 Discharge date: 05/16/2017  Admission Diagnoses:  Discharge Diagnoses:  Active Problems:   Postoperative pain   Discharged Condition: good  Hospital Course: Patient admitted for postoperative pain control, urinary retention, and constipation.  Patient hydrated.  Started on Flomax temporarily.  And bowel program was instituted.  Pain controlled much better with anti-inflammatory medications and rest.  Patient now doing well.  Voiding well.  Constipation resolved.  Ambulating without difficulty.  Ready for discharge home.  Consults:   Significant Diagnostic Studies:   Treatments:   Discharge Exam: Blood pressure 113/79, pulse 83, temperature 98.3 F (36.8 C), temperature source Oral, resp. rate 18, height 6\' 2"  (1.88 m), weight 127.3 kg (280 lb 10.3 oz), SpO2 100 %. Awake and alert.  Oriented and appropriate.  Cranial nerve function intact.  Motor and sensory function extremities normal.  Wound clean and dry.  Chest and abdomen benign.  Disposition: 01-Home or Self Care   Allergies as of 05/16/2017      Reactions   Adhesive [tape] Dermatitis   Skin irritation       Medication List    TAKE these medications   atorvastatin 40 MG tablet Commonly known as:  LIPITOR Take 40 mg by mouth every morning.   diazepam 5 MG tablet Commonly known as:  VALIUM Take 1 tablet (5 mg total) by mouth every 6 (six) hours as needed for anxiety. What changed:  You were already taking a medication with the same name, and this prescription was added. Make sure you understand how and when to take each.   diazepam 5 MG tablet Commonly known as:  VALIUM Take 1-2 tablets (5-10 mg total) by mouth every 6 (six) hours as needed for muscle spasms. What changed:  Another medication with the same name was added. Make sure you understand how and when to take each.   divalproex 250 MG  DR tablet Commonly known as:  DEPAKOTE Take 1 tablet (250 mg total) by mouth every 12 (twelve) hours. What changed:    how much to take  when to take this   fluticasone 50 MCG/ACT nasal spray Commonly known as:  FLONASE Place 1 spray into both nostrils daily.   glycerin adult 2 g suppository Place 1 suppository rectally daily as needed for constipation.   lisinopril-hydrochlorothiazide 20-25 MG tablet Commonly known as:  PRINZIDE,ZESTORETIC Take 1 tablet by mouth daily.   magnesium citrate Soln Take 1 Bottle by mouth once as needed for mild constipation or severe constipation.   methylPREDNISolone 4 MG Tbpk tablet Commonly known as:  MEDROL DOSEPAK follow package directions   omeprazole 20 MG capsule Commonly known as:  PRILOSEC Take 20 mg by mouth every morning.   Oxycodone HCl 10 MG Tabs Take 1-2 tablets (10-20 mg total) by mouth every 4 (four) hours as needed ((score 7 to 10)). What changed:    how much to take  when to take this  reasons to take this   psyllium 58.6 % powder Commonly known as:  METAMUCIL Take 1 packet by mouth every evening.   senna 8.6 MG Tabs tablet Commonly known as:  SENOKOT Take 1 tablet by mouth daily as needed for mild constipation.   topiramate 25 MG tablet Commonly known as:  TOPAMAX Take 50 mg by mouth 2 (two) times daily.   traMADol 50 MG tablet Commonly known as:  ULTRAM Take 25-50 mg by mouth every  4 (four) hours as needed for moderate pain.   zolpidem 5 MG tablet Commonly known as:  AMBIEN Take 5-10 mg by mouth at bedtime as needed for sleep.        Signed: Kathaleen MaserHenry A Teighan Aubert 05/16/2017, 10:00 AM

## 2017-05-16 NOTE — Care Management Note (Signed)
Case Management Note  Patient Details  Name: Daniel Villanueva MRN: [id number] Date of Birth: [birth year]  Subjective/Objective:     Pt in with post op pain from prior lumbar fusion. He is from home with his spouse.                Action/Plan: No f/u and no DME needs per PT. Pt discharging home with his wife. Pt has hospital follow up, insurance and transportation home. No further needs per CM.   Expected Discharge Date:  05/16/17               Expected Discharge Plan:  Home/Self Care  In-House Referral:     Discharge planning Services     Post Acute Care Choice:    Choice offered to:     DME Arranged:    DME Agency:     HH Arranged:    HH Agency:     Status of Service:  Completed, signed off  If discussed at [employer] Length [employer] Stay Meetings, dates discussed:    Additional Comments:  Kermit BaloKelli F Jowel Waltner, RN 05/16/2017, 10:27 AM

## 2017-05-16 NOTE — Progress Notes (Signed)
OOB TO SHOWER WITH OBSERVATION. OOB TO WALK IN HALL, LENGTH OF HALLWAY X 2 INDEPENDENT. STATES THAT HAS VOIDED X2 AND 1 BM.

## 2017-12-31 ENCOUNTER — Institutional Professional Consult (permissible substitution): Payer: Self-pay | Admitting: Neurology

## 2018-07-20 ENCOUNTER — Other Ambulatory Visit: Payer: Self-pay | Admitting: Cardiology

## 2018-07-21 ENCOUNTER — Other Ambulatory Visit: Payer: Self-pay | Admitting: Cardiology

## 2018-07-22 ENCOUNTER — Other Ambulatory Visit: Payer: Self-pay | Admitting: Neurosurgery

## 2018-07-22 DIAGNOSIS — M5416 Radiculopathy, lumbar region: Secondary | ICD-10-CM

## 2018-07-26 ENCOUNTER — Other Ambulatory Visit: Payer: Self-pay

## 2018-08-01 ENCOUNTER — Other Ambulatory Visit: Payer: Self-pay

## 2018-08-18 ENCOUNTER — Other Ambulatory Visit: Payer: Self-pay | Admitting: Cardiology

## 2018-09-18 NOTE — Progress Notes (Deleted)
Primary Physician/Referring:  System, Pcp Not In  Patient ID: Daniel Villanueva, male    DOB: [birth year], 58 y.o.   MRN: [id number]  No chief complaint on file.   HPI: Daniel Villanueva  is a 58 y.o. male  with  hypertension, hyperlipidemia, migraineous vertigo recently referred to [place] for evaluation of elevated blood pressure and risk stratification due to family history. He did not undergo stress testing as he had recently undergone back surgery without CV complication. Underwent echocardiogram On 10/05/2017 revealing normal LVEF and mildly dilated ascending aorta at 3.8 cm.  Patient presents for follow-up for resistant hypertension. He had spinal surgery on 02/04/2018 and is continuing to recover from this. He has completed physical therapy, but continues to have mild back pain. He is tolerating medications well and states that his blood pressure has been stable. No new complaints today.  Past Medical History:  Diagnosis Date  . Arthritis    back, neck &hands   . GERD (gastroesophageal reflux disease)   . Headache    controlled on Depakote  . High cholesterol   . History of hiatal hernia   . Hypertension   . Spondylolisthesis of lumbar region     Past Surgical History:  Procedure Laterality Date  . EYE SURGERY     bot eyes- cataracts removed- /w IOL  . HEMORROIDECTOMY  1988  . HERNIA REPAIR  1998   umbilical hernia   . TONSILLECTOMY      Social History   Socioeconomic History  . Marital status: Married    Spouse name: Not on file  . Number of children: Not on file  . Years of education: Not on file  . Highest education level: Not on file  Occupational History  . Not on file  Social Needs  . Financial resource strain: Not on file  . Food insecurity:    Worry: Not on file    Inability: Not on file  . Transportation needs:    Medical: Not on file    Non-medical: Not on file  Tobacco Use  . Smoking status: Former Smoker    Years: 10.00    Last attempt to quit: 05/02/1988     Years since quitting: 30.4  . Smokeless tobacco: Never Used  Substance and Sexual Activity  . Alcohol use: Yes    Comment: couple times per week, BEER  . Drug use: No  . Sexual activity: Not on file  Lifestyle  . Physical activity:    Days per week: Not on file    Minutes per session: Not on file  . Stress: Not on file  Relationships  . Social connections:    Talks on phone: Not on file    Gets together: Not on file    Attends religious service: Not on file    Active member of club or organization: Not on file    Attends meetings of clubs or organizations: Not on file    Relationship status: Not on file  . Intimate partner violence:    Fear of current or ex partner: Not on file    Emotionally abused: Not on file    Physically abused: Not on file    Forced sexual activity: Not on file  Other Topics Concern  . Not on file  Social History Narrative  . Not on file    Current Outpatient Medications on File Prior to Visit  Medication Sig Dispense Refill  . amLODipine (NORVASC) 5 MG tablet TAKE 1 TABLET BY  MOUTH DAILY. DISCONTINUE TRIBENZOR 90 tablet 1  . atorvastatin (LIPITOR) 40 MG tablet Take 40 mg by mouth every morning.    . carvedilol (COREG) 3.125 MG tablet TAKE 1 TABLET BY MOUTH TWICE DAILY 180 tablet 1  . diazepam (VALIUM) 5 MG tablet Take 1 tablet (5 mg total) by mouth every 6 (six) hours as needed for anxiety. 30 tablet 0  . diazepam (VALIUM) 5 MG tablet Take 1-2 tablets (5-10 mg total) by mouth every 6 (six) hours as needed for muscle spasms. 50 tablet 0  . divalproex (DEPAKOTE) 250 MG DR tablet Take 1 tablet (250 mg total) by mouth every 12 (twelve) hours. (Patient taking differently: Take 500 mg by mouth daily. ) 60 tablet 0  . fluticasone (FLONASE) 50 MCG/ACT nasal spray Place 1 spray into both nostrils daily.    Marland Kitchen glycerin adult 2 g suppository Place 1 suppository rectally daily as needed for constipation.    Marland Kitchen lisinopril-hydrochlorothiazide (PRINZIDE,ZESTORETIC)  20-25 MG tablet Take 1 tablet by mouth daily.    Marland Kitchen losartan-hydrochlorothiazide (HYZAAR) 50-12.5 MG tablet TAKE 1 TABLET BY MOUTH DAILY( DISCONTINUE TRIBENZOR) 90 tablet 1  . magnesium citrate SOLN Take 1 Bottle by mouth once as needed for mild constipation or severe constipation.    . methylPREDNISolone (MEDROL DOSEPAK) 4 MG TBPK tablet follow package directions 21 tablet 0  . omeprazole (PRILOSEC) 20 MG capsule Take 20 mg by mouth every morning.    . Oxycodone HCl 10 MG TABS Take 1-2 tablets (10-20 mg total) by mouth every 4 (four) hours as needed ((score 7 to 10)). 60 tablet 0  . psyllium (METAMUCIL) 58.6 % powder Take 1 packet by mouth every evening.    . senna (SENOKOT) 8.6 MG TABS tablet Take 1 tablet by mouth daily as needed for mild constipation.    Marland Kitchen spironolactone (ALDACTONE) 25 MG tablet TAKE 1 TABLET BY MOUTH EVERY MORNING 90 tablet 0  . topiramate (TOPAMAX) 25 MG tablet Take 50 mg by mouth 2 (two) times daily.  0  . traMADol (ULTRAM) 50 MG tablet Take 25-50 mg by mouth every 4 (four) hours as needed for moderate pain.     Marland Kitchen zolpidem (AMBIEN) 5 MG tablet Take 5-10 mg by mouth at bedtime as needed for sleep.  5   No current facility-administered medications on file prior to visit.     ***Review of Systems  Constitution: Negative for decreased appetite, malaise/fatigue, weight gain and weight loss.  Eyes: Negative for visual disturbance.  Cardiovascular: Positive for leg swelling (left leg). Negative for chest pain, claudication, dyspnea on exertion, orthopnea, palpitations and syncope.  Respiratory: Negative for hemoptysis and wheezing.   Endocrine: Negative for cold intolerance and heat intolerance.  Hematologic/Lymphatic: Does not bruise/bleed easily.  Skin: Negative for nail changes.  Musculoskeletal: Positive for back pain and muscle weakness (bilateral hip and upper legs). Negative for myalgias.  Gastrointestinal: Negative for abdominal pain, change in bowel habit, nausea and  vomiting.  Neurological: Negative for difficulty with concentration, dizziness, focal weakness and headaches.  Psychiatric/Behavioral: Negative for altered mental status and suicidal ideas.  All other systems reviewed and are negative.     Objective  There were no vitals taken for this visit. There is no height or weight on file to calculate BMI.    ***Physical Exam  Constitutional: He is oriented to person, place, and time. Vital signs are normal. He appears well-developed and well-nourished.  HENT:  Head: Normocephalic and atraumatic.  Neck: Normal range of motion.  Cardiovascular: Normal rate, regular rhythm, normal heart sounds and intact distal pulses.  Pulses:      Dorsalis pedis pulses are 1+ on the right side and 1+ on the left side.       Posterior tibial pulses are 2+ on the right side and 2+ on the left side.  Left: varicose veins Bilateral acrocyanotic Trace edema  Pulmonary/Chest: Effort normal and breath sounds normal. No accessory muscle usage. No respiratory distress.  Abdominal: Soft. Bowel sounds are normal.  Musculoskeletal: Normal range of motion.  Neurological: He is alert and oriented to person, place, and time.  Skin: Skin is warm and dry.  Vitals reviewed.  Radiology: No results found.  Laboratory examination:   *** CMP Latest Ref Rng & Units 05/15/2017 05/14/2017 05/02/2017  Glucose 65 - 99 mg/dL 098(J172(H) 191(Y104(H) 782(N113(H)  BUN 6 - 20 mg/dL 12 8 8   Creatinine 0.61 - 1.24 mg/dL 5.620.75 1.300.73 8.650.80  Sodium 135 - 145 mmol/L 129(L) 125(L) 136  Potassium 3.5 - 5.1 mmol/L 5.2(H) 3.4(L) 3.8  Chloride 101 - 111 mmol/L 95(L) 86(L) 102  CO2 22 - 32 mmol/L 26 28 27   Calcium 8.9 - 10.3 mg/dL 8.2(L) 8.6(L) 8.9  Total Protein 6.5 - 8.1 g/dL - - 6.3(L)  Total Bilirubin 0.3 - 1.2 mg/dL - - 0.9  Alkaline Phos 38 - 126 U/L - - 54  AST 15 - 41 U/L - - 25  ALT 17 - 63 U/L - - 24   CBC Latest Ref Rng & Units 05/14/2017 05/02/2017 08/15/2015  WBC 4.0 - 10.5 K/uL 8.2 7.1 8.0   Hemoglobin 13.0 - 17.0 g/dL 78.414.4 69.616.9 18.5(H)  Hematocrit 39.0 - 52.0 % 40.8 47.2 51.5  Platelets 150 - 400 K/uL 196 154 201   Lipid Panel  No results found for: CHOL, TRIG, HDL, CHOLHDL, VLDL, LDLCALC, LDLDIRECT HEMOGLOBIN A1C No results found for: HGBA1C, MPG TSH No results for input(s): TSH in the last 8760 hours.  Cardiac Studies:   Echo 10/05/2017: 1. Left ventricle cavity is normal in size. Normal global wall motion. Visual EF is 55-60%. 2. Mild aortic valve leaflet thickening. 3. Trace tricuspid regurgitation. 4. Mildly dilated ascending aorta, measures 3.8 cm.  Assessment   No diagnosis found.   EKG 03/20/2018: Normal sinus rhythm at 73 bpm, normal axis, early R-wave progression, nonspecific inferior T-wave abnormality. No significant change compared to EKG in May 2019.  Recommendations:   ***  Toniann FailAshton Haynes Kelley, MD, [facility] HospitalFACC 09/18/2018, 1:47 PM [facility] [place] Pager: [phone] Office: [phone] If no answer Cell [phone]

## 2018-09-19 ENCOUNTER — Ambulatory Visit: Payer: Self-pay | Admitting: Cardiology

## 2018-09-24 ENCOUNTER — Ambulatory Visit
Admission: RE | Admit: 2018-09-24 | Discharge: 2018-09-24 | Disposition: A | Discharge status: Home / Self Care | Payer: Managed Care, Other (non HMO) | Source: Ambulatory Visit | Attending: Neurosurgery | Admitting: Neurosurgery

## 2018-09-24 ENCOUNTER — Other Ambulatory Visit: Payer: Self-pay

## 2018-09-24 DIAGNOSIS — M5416 Radiculopathy, lumbar region: Secondary | ICD-10-CM

## 2018-09-24 MED ORDER — GADOBENATE DIMEGLUMINE 529 MG/ML IV SOLN
20.0000 mL | Freq: Once | INTRAVENOUS | Status: AC | PRN
  Administered 2018-09-24: 20 mL via INTRAVENOUS

## 2018-11-06 ENCOUNTER — Other Ambulatory Visit: Payer: Self-pay

## 2018-11-06 ENCOUNTER — Telehealth (INDEPENDENT_AMBULATORY_CARE_PROVIDER_SITE_OTHER): Payer: Managed Care, Other (non HMO) | Admitting: Cardiology

## 2018-11-06 ENCOUNTER — Encounter: Payer: Self-pay | Admitting: Cardiology

## 2018-11-06 VITALS — BP 135/78 | HR 74 | Ht 74.0 in | Wt 270.0 lb

## 2018-11-06 DIAGNOSIS — M5442 Lumbago with sciatica, left side: Secondary | ICD-10-CM

## 2018-11-06 DIAGNOSIS — G8929 Other chronic pain: Secondary | ICD-10-CM

## 2018-11-06 DIAGNOSIS — I714 Abdominal aortic aneurysm, without rupture, unspecified: Secondary | ICD-10-CM | POA: Insufficient documentation

## 2018-11-06 DIAGNOSIS — Z8249 Family history of ischemic heart disease and other diseases of the circulatory system: Secondary | ICD-10-CM | POA: Diagnosis not present

## 2018-11-06 DIAGNOSIS — I10 Essential (primary) hypertension: Secondary | ICD-10-CM

## 2018-11-06 DIAGNOSIS — I77810 Thoracic aortic ectasia: Secondary | ICD-10-CM

## 2018-11-06 DIAGNOSIS — M5441 Lumbago with sciatica, right side: Secondary | ICD-10-CM

## 2018-11-06 NOTE — Progress Notes (Signed)
Primary Physician:  System, Pcp Not In   Patient ID: Daniel Villanueva, male    DOB: 28-Feb-1961, 58 y.o.   MRN: [id number]  Subjective:   Follow up for: HTN  This visit type was conducted due to national recommendations for restrictions regarding the COVID-19 Pandemic (e.g. social distancing).  This format is felt to be most appropriate for this patient at this time.  All issues noted in this document were discussed and addressed.  No physical exam was performed (except for noted visual exam findings with Telehealth visits).  The patient has consented to conduct a Telehealth visit and understands insurance will be billed.   I discussed the limitations of evaluation and management by telemedicine and the availability of in person appointments. The patient expressed understanding and agreed to proceed.  Virtual Visit via Video Note is as below  I connected with Daniel Villanueva, on 11/06/18 at 0945 by a video enabled telemedicine application and verified that I am speaking with the correct person using two identifiers.     I have discussed with her regarding the safety during COVID Pandemic and steps and precautions including social distancing with the patient.   HPI: Daniel Villanueva  is a 58 y.o. male  with hypertension, hyperlipidemia, migraineous vertigo recently referred to us for evaluation of elevated blood pressure and risk stratification due to family history. He did not undergo stress testing as he had recently undergone back surgery without CV complication. Underwent echocardiogram On 10/05/2017 revealing normal LVEF and mildly dilated ascending aorta at 3.8 cm.  This is a 6 month office visit. Carvedilol dose was decreased at last visit to 3.125 mg BID. He did not have echocardiogram performed due to having to pay out of pocket. He is tolerating medications well and states that his blood pressure has been stable. No new complaints today. Continues to have back pain that limits some of his activity.  Overall he feels well.   No chest pain or dyspnea on exertion. Has chronic leg swelling at the end of the day.   Past Medical History:  Diagnosis Date  . Arthritis    back, neck &hands   . GERD (gastroesophageal reflux disease)   . Headache    controlled on Depakote  . High cholesterol   . History of hiatal hernia   . Hypertension   . Spondylolisthesis of lumbar region     Past Surgical History:  Procedure Laterality Date  . EYE SURGERY     bot eyes- cataracts removed- /w IOL  . HEMORROIDECTOMY  1988  . HERNIA REPAIR  1998   umbilical hernia   . TONSILLECTOMY      Social History   Socioeconomic History  . Marital status: Married    Spouse name: Not on file  . Number of children: Not on file  . Years of education: Not on file  . Highest education level: Not on file  Occupational History  . Not on file  Social Needs  . Financial resource strain: Not on file  . Food insecurity    Worry: Not on file    Inability: Not on file  . Transportation needs    Medical: Not on file    Non-medical: Not on file  Tobacco Use  . Smoking status: Former Smoker    Years: 10.00    Quit date: 05/02/1988    Years since quitting: 30.5  . Smokeless tobacco: Never Used  Substance and Sexual Activity  . Alcohol use: Yes  Comment: couple times per week, BEER  . Drug use: No  . Sexual activity: Not on file  Lifestyle  . Physical activity    Days per week: Not on file    Minutes per session: Not on file  . Stress: Not on file  Relationships  . Social Musicianconnections    Talks on phone: Not on file    Gets together: Not on file    Attends religious service: Not on file    Active member of club or organization: Not on file    Attends meetings of clubs or organizations: Not on file    Relationship status: Not on file  . Intimate partner violence    Fear of current or ex partner: Not on file    Emotionally abused: Not on file    Physically abused: Not on file    Forced sexual  activity: Not on file  Other Topics Concern  . Not on file  Social History Narrative  . Not on file    Review of Systems  Constitution: Negative for decreased appetite, malaise/fatigue, weight gain and weight loss.  Eyes: Negative for visual disturbance.  Cardiovascular: Positive for leg swelling. Negative for chest pain, claudication, dyspnea on exertion, orthopnea, palpitations and syncope.  Respiratory: Negative for hemoptysis and wheezing.   Endocrine: Negative for cold intolerance and heat intolerance.  Hematologic/Lymphatic: Does not bruise/bleed easily.  Skin: Negative for nail changes.  Musculoskeletal: Positive for back pain and muscle weakness. Negative for myalgias.  Gastrointestinal: Negative for abdominal pain, change in bowel habit, nausea and vomiting.  Neurological: Negative for difficulty with concentration, dizziness, focal weakness and headaches.  Psychiatric/Behavioral: Negative for altered mental status and suicidal ideas.  All other systems reviewed and are negative.     Objective:  Blood pressure 135/78, pulse 74, height 6\' 2"  (1.88 m), weight 270 lb (122.5 kg). Body mass index is 34.67 kg/m.    Physical exam not performed or limited due to virtual visit.  Patient appeared to be in no distress, Neck was supple, respiration was not labored.  Please see exam details from prior visit is as below.   Physical Exam  Constitutional: He is oriented to person, place, and time. Vital signs are normal. He appears well-developed and well-nourished.  HENT:  Head: Normocephalic and atraumatic.  Neck: Normal range of motion.  Cardiovascular: Normal rate, regular rhythm, normal heart sounds and intact distal pulses.  Pulses:      Popliteal pulses are 2+ on the right side and 2+ on the left side.       Dorsalis pedis pulses are 1+ on the right side and 1+ on the left side.       Posterior tibial pulses are 2+ on the right side and 2+ on the left side.  Bilateral trace  edema  Pulmonary/Chest: Effort normal and breath sounds normal. No accessory muscle usage. No respiratory distress.  Abdominal: Soft. Bowel sounds are normal.  Musculoskeletal: Normal range of motion.  Neurological: He is alert and oriented to person, place, and time.  Skin: Skin is warm and dry.  Vitals reviewed.  Radiology: No results found.  Laboratory examination:    CMP Latest Ref Rng & Units 05/15/2017 05/14/2017 05/02/2017  Glucose 65 - 99 mg/dL 161(W172(H) 960(A104(H) 540(J113(H)  BUN 6 - 20 mg/dL 12 8 8   Creatinine 0.61 - 1.24 mg/dL 8.110.75 9.140.73 7.820.80  Sodium 135 - 145 mmol/L 129(L) 125(L) 136  Potassium 3.5 - 5.1 mmol/L 5.2(H) 3.4(L) 3.8  Chloride 101 -  111 mmol/L 95(L) 86(L) 102  CO2 22 - 32 mmol/L 26 28 27   Calcium 8.9 - 10.3 mg/dL 8.2(L) 8.6(L) 8.9  Total Protein 6.5 - 8.1 g/dL - - 6.3(L)  Total Bilirubin 0.3 - 1.2 mg/dL - - 0.9  Alkaline Phos 38 - 126 U/L - - 54  AST 15 - 41 U/L - - 25  ALT 17 - 63 U/L - - 24   CBC Latest Ref Rng & Units 05/14/2017 05/02/2017 08/15/2015  WBC 4.0 - 10.5 K/uL 8.2 7.1 8.0  Hemoglobin 13.0 - 17.0 g/dL 40.914.4 81.116.9 18.5(H)  Hematocrit 39.0 - 52.0 % 40.8 47.2 51.5  Platelets 150 - 400 K/uL 196 154 201   Lipid Panel  No results found for: CHOL, TRIG, HDL, CHOLHDL, VLDL, LDLCALC, LDLDIRECT HEMOGLOBIN A1C No results found for: HGBA1C, MPG TSH No results for input(s): TSH in the last 8760 hours.  PRN Meds:. Medications Discontinued During This Encounter  Medication Reason  . diazepam (VALIUM) 5 MG tablet Error  . diazepam (VALIUM) 5 MG tablet Error  . topiramate (TOPAMAX) 25 MG tablet Error   Current Meds  Medication Sig  . amLODipine (NORVASC) 5 MG tablet TAKE 1 TABLET BY MOUTH DAILY. DISCONTINUE TRIBENZOR  . atorvastatin (LIPITOR) 40 MG tablet Take 40 mg by mouth every morning.  . carvedilol (COREG) 3.125 MG tablet TAKE 1 TABLET BY MOUTH TWICE DAILY  . divalproex (DEPAKOTE) 250 MG DR tablet Take 1 tablet (250 mg total) by mouth every 12 (twelve) hours.  (Patient taking differently: Take 250 mg by mouth 2 (two) times daily. )  . losartan-hydrochlorothiazide (HYZAAR) 50-12.5 MG tablet TAKE 1 TABLET BY MOUTH DAILY( DISCONTINUE TRIBENZOR)  . omeprazole (PRILOSEC) 20 MG capsule Take 20 mg by mouth every morning.  . Oxycodone HCl 10 MG TABS Take 1-2 tablets (10-20 mg total) by mouth every 4 (four) hours as needed ((score 7 to 10)).  Marland Kitchen. psyllium (METAMUCIL) 58.6 % powder Take 1 packet by mouth every evening.  . senna (SENOKOT) 8.6 MG TABS tablet Take 1 tablet by mouth daily as needed for mild constipation.  Marland Kitchen. spironolactone (ALDACTONE) 25 MG tablet TAKE 1 TABLET BY MOUTH EVERY MORNING  . traMADol (ULTRAM) 50 MG tablet Take 25-50 mg by mouth every 4 (four) hours as needed for moderate pain.   Marland Kitchen. zolpidem (AMBIEN) 5 MG tablet Take 5-10 mg by mouth at bedtime as needed for sleep.    Cardiac Studies:   Echo- 10/05/2017 1. Left ventricle cavity is normal in size. Normal global wall motion. Visual EF is 55-60%. 2. Mild aortic valve leaflet thickening. 3. Trace tricuspid regurgitation. 4. Mildly dilated ascending aorta, measures 3.8 cm.  Assessment:     ICD-10-CM   1. Ascending aorta dilation (HCC)  I77.810   2. Essential hypertension  I10   3. Family history of early CAD  31Z82.49   4. Chronic bilateral low back pain with bilateral sciatica  M54.42    M54.41    G89.29     EKG 03/20/2018: Normal sinus rhythm at 73 bpm, normal axis, early R-wave progression, nonspecific inferior T-wave abnormality. No significant change compared to EKG in May 2019.   Recommendations:   Patient is presently doing well, blood pressure remains well controlled. He is due to have repeat echocardiogram for surveillance of ascending aortic dilation, will reorder this. Suspect related to bodily habitus. I will notify him of echo results.  He is scheduled to have labs with PCP next week and will have copy sent to  [place] for evaluation. He is interested in losing weight. We  have discussed diet changes that he can make with calorie restriction. I have also encouraged him to start exercise with riding a stationary bicycle. Due to chronic back pain, his exercise has been limited.  Patient is without symptoms of angina. He has not undergone stress testing as he is without symptoms and had recently undergone spinal surgery without perioperative complications. We will see him back in 1 year or sooner if problems.   Miquel Dunn, MSN, APRN, FNP-C [facility]. [place] Office: [phone] Fax: [phone]

## 2018-11-21 ENCOUNTER — Other Ambulatory Visit: Payer: Self-pay | Admitting: Cardiology

## 2018-11-21 NOTE — Telephone Encounter (Signed)
Please fill

## 2018-12-30 ENCOUNTER — Other Ambulatory Visit: Payer: Self-pay

## 2018-12-30 ENCOUNTER — Ambulatory Visit (INDEPENDENT_AMBULATORY_CARE_PROVIDER_SITE_OTHER): Payer: Managed Care, Other (non HMO)

## 2018-12-30 DIAGNOSIS — I77810 Thoracic aortic ectasia: Secondary | ICD-10-CM

## 2018-12-30 DIAGNOSIS — I10 Essential (primary) hypertension: Secondary | ICD-10-CM

## 2019-01-09 NOTE — Progress Notes (Signed)
Mild increase in aortic root dilation from 3.8 to 4.3 cm. Will need to CTA of chest to confirm size since there has been a increase

## 2019-01-19 ENCOUNTER — Other Ambulatory Visit: Payer: Self-pay | Admitting: Cardiology

## 2019-01-20 ENCOUNTER — Other Ambulatory Visit: Payer: Self-pay

## 2019-01-20 DIAGNOSIS — I714 Abdominal aortic aneurysm, without rupture, unspecified: Secondary | ICD-10-CM

## 2019-01-20 MED ORDER — CARVEDILOL 3.125 MG PO TABS: 3.1250 mg | ORAL_TABLET | Freq: Two times a day (BID) | ORAL | 1 refills | Status: DC

## 2019-01-22 ENCOUNTER — Other Ambulatory Visit: Payer: Self-pay

## 2019-02-05 ENCOUNTER — Other Ambulatory Visit: Payer: Self-pay | Admitting: Cardiology

## 2019-02-05 DIAGNOSIS — I712 Thoracic aortic aneurysm, without rupture, unspecified: Secondary | ICD-10-CM

## 2019-02-21 ENCOUNTER — Other Ambulatory Visit: Payer: Self-pay

## 2019-02-21 MED ORDER — SPIRONOLACTONE 25 MG PO TABS: 25.0000 mg | ORAL_TABLET | Freq: Every morning | ORAL | 3 refills | Status: DC

## 2019-03-17 ENCOUNTER — Telehealth: Payer: Self-pay

## 2019-03-17 NOTE — Telephone Encounter (Signed)
Pt calling about CT??? Results. I see echo. Please advise.//ah

## 2019-03-17 NOTE — Telephone Encounter (Signed)
Pt aware of results.//ah

## 2019-03-17 NOTE — Telephone Encounter (Signed)
CTA showed ascending aorta of 3.6 cm, which is good. No aneurysm noted. Looks better than our echo as ours had shown it to be 4.3 cm compared to previous of 3.8 cm. CTA is more definitive

## 2019-07-18 ENCOUNTER — Other Ambulatory Visit: Payer: Self-pay | Admitting: Cardiology

## 2019-07-18 DIAGNOSIS — I714 Abdominal aortic aneurysm, without rupture, unspecified: Secondary | ICD-10-CM

## 2019-11-12 ENCOUNTER — Ambulatory Visit: Payer: Managed Care, Other (non HMO) | Admitting: Cardiology

## 2019-11-12 ENCOUNTER — Encounter: Payer: Self-pay | Admitting: Cardiology

## 2019-11-12 ENCOUNTER — Other Ambulatory Visit: Payer: Self-pay

## 2019-11-12 VITALS — BP 124/86 | HR 72 | Resp 16 | Ht 74.0 in | Wt 273.0 lb

## 2019-11-12 DIAGNOSIS — E7800 Pure hypercholesterolemia, unspecified: Secondary | ICD-10-CM

## 2019-11-12 DIAGNOSIS — I2510 Atherosclerotic heart disease of native coronary artery without angina pectoris: Secondary | ICD-10-CM

## 2019-11-12 DIAGNOSIS — I10 Essential (primary) hypertension: Secondary | ICD-10-CM

## 2019-11-12 DIAGNOSIS — E6609 Other obesity due to excess calories: Secondary | ICD-10-CM

## 2019-11-12 DIAGNOSIS — Z87891 Personal history of nicotine dependence: Secondary | ICD-10-CM

## 2019-11-12 MED ORDER — CARVEDILOL 3.125 MG PO TABS: 3.1250 mg | ORAL_TABLET | Freq: Two times a day (BID) | ORAL | 1 refills | Status: DC

## 2019-11-12 MED ORDER — AMLODIPINE BESYLATE 5 MG PO TABS: 5.0000 mg | ORAL_TABLET | Freq: Every day | ORAL | 1 refills | Status: DC

## 2019-11-12 MED ORDER — ATORVASTATIN CALCIUM 40 MG PO TABS: 40.0000 mg | ORAL_TABLET | Freq: Every day | ORAL | 1 refills | Status: DC

## 2019-11-12 NOTE — Progress Notes (Signed)
Daniel Villanueva Date of Birth: July 16, 1960 MRN: [id number] Primary Care Provider:[place], Cala Bradford, NP Former Cardiology Providers: Altamese Rolla, APRN, FNP-C Primary Cardiologist: Tessa Lerner, DO, [facility] (established care 11/12/2019)  Date: 11/12/19 Last Visit: 11/06/2018  Chief Complaint  Patient presents with  . Follow-up    1 year  . Hypertension  . Aortic Root Dilation    HPI  Daniel Villanueva is a 59 y.o.  male who presents to the office with a chief complaint of " follow-up on aortic root dilatation and hypertension." Patient's past medical history and cardiovascular risk factors include: Coronary artery calcification, hypertension, hyperlipidemia, former smoker, obesity due to excess calories.  Patient is accompanied by his wife Daniel Villanueva at today's office visit.  Patient was last seen in the office by Altamese Vieques back in July 2020 for evaluation and management of hypertension and aortic root dilatation.  Since last office visit patient has not been hospitalized or seen in urgent care for cardiovascular symptoms.  He denies any chest pain or shortness of breath at rest or with effort related activity.  Since last office visit he has undergone CTA of the chest to evaluate for aortic root dilatation at  [facility] in October 2020 and per report no evidence of aneurysmal dilatation of the thoracic aorta.  Patient's blood pressure has relatively been within normal limits and he takes his medications as scheduled.  He denies any side effects or intolerances.  He has not had recent blood work but will be having blood work with his upcoming physical next week.  Based on the CT of the chest done in October 2020 he was noted to have mild coronary artery calcification.  He is currently on statin therapy.  No recent stress test to evaluate for either exercise or reversible ischemia.  History of coronary artery calcification on non-gated CT scan.  Denies prior history of  myocardial  infarction, congestive heart failure, deep venous thrombosis, pulmonary embolism, stroke, transient ischemic attack.  FUNCTIONAL STATUS: Swims 2-3 x per week.    ALLERGIES: Allergies  Allergen Reactions  . Adhesive [Tape] Dermatitis    Skin irritation      MEDICATION LIST PRIOR TO VISIT: Current Outpatient Medications on File Prior to Visit  Medication Sig Dispense Refill  . cyclobenzaprine (FLEXERIL) 10 MG tablet as needed.    . divalproex (DEPAKOTE) 250 MG DR tablet Take 1 tablet (250 mg total) by mouth every 12 (twelve) hours. (Patient taking differently: Take 250 mg by mouth 2 (two) times daily. ) 60 tablet 0  . fluticasone (FLONASE) 50 MCG/ACT nasal spray Place 1 spray into both nostrils daily.    Marland Kitchen HYDROcodone-acetaminophen (NORCO) 10-325 MG tablet Take 1 tablet by mouth every 6 (six) hours as needed.    Marland Kitchen losartan-hydrochlorothiazide (HYZAAR) 50-12.5 MG tablet TAKE 1 TABLET BY MOUTH DAILY. DISCONTINUE TRIBENZOR 90 tablet 1  . magnesium citrate SOLN Take 1 Bottle by mouth once as needed for mild constipation or severe constipation.    Marland Kitchen omeprazole (PRILOSEC) 20 MG capsule Take 20 mg by mouth every morning.    . psyllium (METAMUCIL) 58.6 % powder Take 1 packet by mouth every evening.    Marland Kitchen spironolactone (ALDACTONE) 25 MG tablet Take 1 tablet (25 mg total) by mouth every morning. 90 tablet 3  . Testosterone Cypionate 200 MG/ML SOLN Inject 1 mL as directed every 14 (fourteen) days.    . traMADol (ULTRAM) 50 MG tablet Take 25-50 mg by mouth every 4 (four) hours as  needed for moderate pain.     Marland Kitchen zolpidem (AMBIEN) 5 MG tablet Take 5-10 mg by mouth at bedtime as needed for sleep.  5   No current facility-administered medications on file prior to visit.    PAST MEDICAL HISTORY: Past Medical History:  Diagnosis Date  . Arthritis    back, neck &hands   . Coronary artery calcification   . GERD (gastroesophageal reflux disease)   . Headache    controlled on Depakote  . High  cholesterol   . History of hiatal hernia   . Hypertension   . Spondylolisthesis of lumbar region     PAST SURGICAL HISTORY: Past Surgical History:  Procedure Laterality Date  . BACK SURGERY    . EYE SURGERY     bot eyes- cataracts removed- /w IOL  . HEMORROIDECTOMY  1988  . HERNIA REPAIR  1998   umbilical hernia   . NECK SURGERY    . TONSILLECTOMY      FAMILY HISTORY: The patient's family history includes Heart disease in his father.   SOCIAL HISTORY:  The patient  reports that he quit smoking about 31 years ago. He quit after 10.00 years of use. He has never used smokeless tobacco. He reports current alcohol use. He reports that he does not use drugs.  Review of Systems  Constitutional: Negative for chills and fever.  HENT: Negative for hoarse voice and nosebleeds.   Eyes: Negative for discharge, double vision and pain.  Cardiovascular: Negative for chest pain, claudication, dyspnea on exertion, leg swelling, near-syncope, orthopnea, palpitations, paroxysmal nocturnal dyspnea and syncope.  Respiratory: Negative for hemoptysis and shortness of breath.   Musculoskeletal: Negative for muscle cramps and myalgias.  Gastrointestinal: Negative for abdominal pain, constipation, diarrhea, hematemesis, hematochezia, melena, nausea and vomiting.  Neurological: Negative for dizziness and light-headedness.    PHYSICAL EXAM: Vitals with BMI 11/12/2019 11/06/2018 05/16/2017  Height 6\' 2"  6\' 2"  -  Weight 273 lbs 270 lbs -  BMI 35.04 34.65 -  Systolic 124 135  Diastolic 86 78 64  Pulse 72 74 84    CONSTITUTIONAL: Well-developed and well-nourished. No acute distress.  SKIN: Skin is warm and dry. No rash noted. No cyanosis. No pallor. No jaundice HEAD: Normocephalic and atraumatic.  EYES: No scleral icterus MOUTH/THROAT: Moist oral membranes.  NECK: No JVD present. No thyromegaly noted. No carotid bruits  LYMPHATIC: No visible cervical adenopathy.  CHEST Normal respiratory effort. No  intercostal retractions  LUNGS: Clear to auscultation bilaterally.  No stridor. No wheezes. No rales.  CARDIOVASCULAR: Regular rate and rhythm, positive S1-S2, no murmurs rubs or gallops appreciated. ABDOMINAL: Obese, soft, nontender, nondistended, positive bowel sounds in all 4 quadrants, no apparent ascites.  EXTREMITIES: No peripheral edema, 2+ bilateral dorsalis pedis and posterior tibial pulses. HEMATOLOGIC: No significant bruising NEUROLOGIC: Oriented to person, place, and time. Nonfocal. Normal muscle tone.  PSYCHIATRIC: Normal mood and affect. Normal behavior. Cooperative  RADIOLOGY: CTA Chest 02/28/2019 at [facility].  Mild atherosclerotic calcifications of the thoracic aorta without aneurysmal dilatation.  The sinus of Valsalva measures up to 4.4 cm. The sinotubular junction measures 3.4 cm. The ascending aorta measures 3.6 cm. The descending thoracic aorta measures 2.9 cm.  No central pulmonary embolism.   CARDIAC DATABASE: EKG: 11/12/2019: Normal sinus rhythm, 67 bpm, normal axis, poor R wave progression, nonspecific T wave abnormality.  Echocardiogram: 12/30/2018: LVEF 55%, normal diastolic filling pattern, mildly dilated left atrium, mild MR, mild PR, aortic root 4.3 cm.  Stress  Testing:  None  Heart Catheterization: None  LABORATORY DATA: CBC Latest Ref Rng & Units 05/14/2017 05/02/2017 08/15/2015  WBC 4.0 - 10.5 K/uL 8.2 7.1 8.0  Hemoglobin 13.0 - 17.0 g/dL 91.414.4 78.216.9 18.5(H)  Hematocrit 39 - 52 % 40.8 47.2 51.5  Platelets 150 - 400 K/uL 196 154 201    CMP Latest Ref Rng & Units 05/15/2017 05/14/2017 05/02/2017  Glucose 65 - 99 mg/dL 956(O172(H) 130(Q104(H) 657(Q113(H)  BUN 6 - 20 mg/dL 12 8 8   Creatinine 0.61 - 1.24 mg/dL 4.690.75 6.290.73 5.280.80  Sodium 135 - 145 mmol/L 129(L) 125(L) 136  Potassium 3.5 - 5.1 mmol/L 5.2(H) 3.4(L) 3.8  Chloride 101 - 111 mmol/L 95(L) 86(L) 102  CO2 22 - 32 mmol/L 26 28 27   Calcium 8.9 - 10.3 mg/dL 8.2(L) 8.6(L) 8.9  Total Protein 6.5 - 8.1  g/dL - - 6.3(L)  Total Bilirubin 0.3 - 1.2 mg/dL - - 0.9  Alkaline Phos 38 - 126 U/L - - 54  AST 15 - 41 U/L - - 25  ALT 17 - 63 U/L - - 24    Lipid Panel  No results found for: CHOL, TRIG, HDL, CHOLHDL, VLDL, LDLCALC, LDLDIRECT, LABVLDL  No results found for: HGBA1C No components found for: NTPROBNP No results found for: TSH  Cardiac Panel (last 3 results) No results for input(s): CKTOTAL, CKMB, TROPONINIHS, RELINDX in the last 72 hours.  IMPRESSION:    ICD-10-CM   1. Coronary artery calcification seen on computed tomography  I25.10 PCV MYOCARDIAL PERFUSION WITH LEXISCAN  2. Essential hypertension  I10 EKG 12-Lead    amLODipine (NORVASC) 5 MG tablet    carvedilol (COREG) 3.125 MG tablet    CANCELED: EKG 12-Lead  3. Hypercholesteremia  E78.00 atorvastatin (LIPITOR) 40 MG tablet  4. Former smoker  Z87.891   5. Class 2 obesity due to excess calories without serious comorbidity with body mass index (BMI) of 35.0 to 35.9 in adult  E66.09    Z68.35      RECOMMENDATIONS: Daniel Benderaul C Scheffel is a 59 y.o. male whose past medical history and cardiovascular risk factors include: Coronary artery calcification, hypertension, hyperlipidemia, former smoker, obesity due to excess calories.  Coronary calcification:  Initiate aspirin 81 mg p.o. daily.  Continue statin therapy.  Patient has fasting lipid profile scheduled with his yearly physical coming up next week.  Echocardiogram results reviewed with the patient and his wife at today's office visit and noted above.  Check nuclear stress test to evaluate for reversible ischemia given multiple cardiovascular risk factors, coronary calcification, and EKG findings.  Patient is unable to exercise secondary to prior neck surgery and back surgery.  Benign essential hypertension: Improving . Office blood pressure is at goal.  . Medication reconciled.  . Low salt diet recommended. A diet that is rich in fruits, vegetables, legumes, and low-fat  dairy products and low in snacks, sweets, and meats (such as the Dietary Approaches to Stop Hypertension [DASH] diet).  . Refilled amlodipine. . Refilled carvedilol. . We will refill Aldactone and hydrochlorothiazide once his lab work is checked next week.  Hypercholesterolemia: Continue statin therapy.  Does not endorse myalgias.  Obesity, due to excess calories: Body mass index is 35.05 kg/m. . I reviewed with the patient the importance of diet, regular physical activity/exercise, weight loss.   . Patient is educated on increasing physical activity gradually as tolerated.  With the goal of moderate intensity exercise for 30 minutes a day 5 days a week.  FINAL MEDICATION  LIST END OF ENCOUNTER: Meds ordered this encounter  Medications  . atorvastatin (LIPITOR) 40 MG tablet    Sig: Take 1 tablet (40 mg total) by mouth at bedtime.    Dispense:  90 tablet    Refill:  1  . amLODipine (NORVASC) 5 MG tablet    Sig: Take 1 tablet (5 mg total) by mouth daily.    Dispense:  90 tablet    Refill:  1  . carvedilol (COREG) 3.125 MG tablet    Sig: Take 1 tablet (3.125 mg total) by mouth 2 (two) times daily with a meal.    Dispense:  180 tablet    Refill:  1     Current Outpatient Medications:  .  amLODipine (NORVASC) 5 MG tablet, Take 1 tablet (5 mg total) by mouth daily., Disp: 90 tablet, Rfl: 1 .  atorvastatin (LIPITOR) 40 MG tablet, Take 1 tablet (40 mg total) by mouth at bedtime., Disp: 90 tablet, Rfl: 1 .  carvedilol (COREG) 3.125 MG tablet, Take 1 tablet (3.125 mg total) by mouth 2 (two) times daily with a meal., Disp: 180 tablet, Rfl: 1 .  cyclobenzaprine (FLEXERIL) 10 MG tablet, as needed., Disp: , Rfl:  .  divalproex (DEPAKOTE) 250 MG DR tablet, Take 1 tablet (250 mg total) by mouth every 12 (twelve) hours. (Patient taking differently: Take 250 mg by mouth 2 (two) times daily. ), Disp: 60 tablet, Rfl: 0 .  fluticasone (FLONASE) 50 MCG/ACT nasal spray, Place 1 spray into both nostrils  daily., Disp: , Rfl:  .  HYDROcodone-acetaminophen (NORCO) 10-325 MG tablet, Take 1 tablet by mouth every 6 (six) hours as needed., Disp: , Rfl:  .  losartan-hydrochlorothiazide (HYZAAR) 50-12.5 MG tablet, TAKE 1 TABLET BY MOUTH DAILY. DISCONTINUE TRIBENZOR, Disp: 90 tablet, Rfl: 1 .  magnesium citrate SOLN, Take 1 Bottle by mouth once as needed for mild constipation or severe constipation., Disp: , Rfl:  .  omeprazole (PRILOSEC) 20 MG capsule, Take 20 mg by mouth every morning., Disp: , Rfl:  .  psyllium (METAMUCIL) 58.6 % powder, Take 1 packet by mouth every evening., Disp: , Rfl:  .  spironolactone (ALDACTONE) 25 MG tablet, Take 1 tablet (25 mg total) by mouth every morning., Disp: 90 tablet, Rfl: 3 .  Testosterone Cypionate 200 MG/ML SOLN, Inject 1 mL as directed every 14 (fourteen) days., Disp: , Rfl:  .  traMADol (ULTRAM) 50 MG tablet, Take 25-50 mg by mouth every 4 (four) hours as needed for moderate pain. , Disp: , Rfl:  .  zolpidem (AMBIEN) 5 MG tablet, Take 5-10 mg by mouth at bedtime as needed for sleep., Disp: , Rfl: 5  Orders Placed This Encounter  Procedures  . PCV MYOCARDIAL PERFUSION WITH LEXISCAN  . EKG 12-Lead   --Continue cardiac medications as reconciled in final medication list. --Return in about 6 weeks (around 12/24/2019) for review test results., BP follow up.. Or sooner if needed. --Continue follow-up with your primary care physician regarding the management of your other chronic comorbid conditions.  Patient's questions and concerns were addressed to his satisfaction. He voices understanding of the instructions provided during this encounter.   This note was created using a voice recognition software as a result there may be grammatical errors inadvertently enclosed that do not reflect the nature of this encounter. Every attempt is made to correct such errors.  Tessa Lerner, [place], [facility]  Pager: [phone] Office: [phone]

## 2019-11-17 ENCOUNTER — Ambulatory Visit: Payer: Managed Care, Other (non HMO) | Admitting: Cardiology

## 2019-12-01 ENCOUNTER — Other Ambulatory Visit: Payer: Self-pay

## 2019-12-01 ENCOUNTER — Ambulatory Visit: Payer: Managed Care, Other (non HMO)

## 2019-12-01 DIAGNOSIS — I2510 Atherosclerotic heart disease of native coronary artery without angina pectoris: Secondary | ICD-10-CM

## 2019-12-09 ENCOUNTER — Telehealth: Payer: Self-pay

## 2019-12-09 NOTE — Telephone Encounter (Signed)
Pt called back due to a miss call and inform him about his  Stress test pt understood

## 2019-12-09 NOTE — Telephone Encounter (Signed)
-----   Message from [place], [place] sent at 12/07/2019 10:20 PM EDT ----- The nuclear stress test that was recently performed was reported as normal perfusion and low risk study.  The other details of the report will be discussed at the next office visit.  If your symptoms have increased in intensity, frequency, duration or new symptoms suggestive of typical chest pain as discussed during last office visit please still seek medical attention at the closest ER via EMS.

## 2019-12-09 NOTE — Telephone Encounter (Signed)
-----   Message from Sunit Tolia, DO sent at 12/07/2019 10:20 PM EDT ----- The nuclear stress test that was recently performed was reported as normal perfusion and low risk study.  The other details of the report will be discussed at the next office visit.  If your symptoms have increased in intensity, frequency, duration or new symptoms suggestive of typical chest pain as discussed during last office visit please still seek medical attention at the closest ER via EMS. 

## 2019-12-09 NOTE — Telephone Encounter (Signed)
Called pt no answer, left a vm about stress test results

## 2019-12-12 ENCOUNTER — Other Ambulatory Visit: Payer: Self-pay | Admitting: Cardiology

## 2019-12-12 DIAGNOSIS — I10 Essential (primary) hypertension: Secondary | ICD-10-CM

## 2020-01-08 ENCOUNTER — Other Ambulatory Visit: Payer: Self-pay | Admitting: Cardiology

## 2020-01-08 DIAGNOSIS — I10 Essential (primary) hypertension: Secondary | ICD-10-CM

## 2020-01-12 NOTE — Progress Notes (Signed)
Daniel Villanueva Date of Birth: 05/17/1960 MRN: [id number] Primary Care Provider:Millsaps, Joelene Millin, NP Former Cardiology Providers: Jeri Lager, APRN, FNP-C Primary Cardiologist: Rex Kras, DO, [facility] (established care 11/12/2019)  Date: 01/13/20 Last Office Visit: 11/12/2019  Chief Complaint  Patient presents with  . Hypertension  . Coronary Artery Disease  . Results  . Follow-up    6 weeks    HPI  Daniel Villanueva is a 59 y.o.  male who presents to the office with a chief complaint of " follow-up on management of coronary artery calcification and review test results." Patient's past medical history and cardiovascular risk factors include: Coronary artery calcification, hypertension, hyperlipidemia, former smoker, obesity due to excess calories.  Patient is being followed management of hypertension and aortic root dilatation.  He had undergone CTA of the chest to evaluate for aortic root dilatation at  [facility] in October 2020 and per report no evidence of aneurysmal dilatation of the thoracic aorta but it note sinus of Valsalva measuring 4.4 cm.  In addition, mild coronary artery calcification.  Since last office visit patient was recommended to undergo stress test to evaluate for reversible ischemia given the underlying coronary artery calcification.  Since then patient has also been initiated on aspirin which is tolerating well.  Stress test noted mild diaphragmatic attenuation artifact with overall myocardial perfusion normal.  LVEF visually appeared normal.  Since last office visit patient has not been hospitalized or seen in urgent care for cardiovascular symptoms. He denies any chest pain or shortness of breath at rest or with effort related activity.   Patient's blood pressure has relatively been within normal limits and he takes his medications as scheduled. He had bloodwork done at his PCP that was requested at today's visit and reviewed with him.   History of  coronary artery calcification on non-gated CT scan.  Denies prior history of  myocardial infarction, congestive heart failure, deep venous thrombosis, pulmonary embolism, stroke, transient ischemic attack.  FUNCTIONAL STATUS: Swims 2-3 x per week.    ALLERGIES: Allergies  Allergen Reactions  . Adhesive [Tape] Dermatitis    Skin irritation      MEDICATION LIST PRIOR TO VISIT: Current Outpatient Medications on File Prior to Visit  Medication Sig Dispense Refill  . amLODipine (NORVASC) 5 MG tablet TAKE 1 TABLET BY MOUTH DAILY 90 tablet 1  . atorvastatin (LIPITOR) 40 MG tablet Take 1 tablet (40 mg total) by mouth at bedtime. 90 tablet 1  . carvedilol (COREG) 3.125 MG tablet TAKE 1 TABLET(3.125 MG) BY MOUTH TWICE DAILY 180 tablet 1  . divalproex (DEPAKOTE) 250 MG DR tablet Take 1 tablet (250 mg total) by mouth every 12 (twelve) hours. (Patient taking differently: Take 250 mg by mouth 2 (two) times daily. ) 60 tablet 0  . fluticasone (FLONASE) 50 MCG/ACT nasal spray Place 1 spray into both nostrils daily.    Marland Kitchen HYDROcodone-acetaminophen (NORCO) 10-325 MG tablet Take 1 tablet by mouth every 6 (six) hours as needed.    Marland Kitchen losartan-hydrochlorothiazide (HYZAAR) 50-12.5 MG tablet TAKE 1 TABLET BY MOUTH DAILY 90 tablet 1  . magnesium citrate SOLN Take 1 Bottle by mouth once as needed for mild constipation or severe constipation.    Marland Kitchen omeprazole (PRILOSEC) 20 MG capsule Take 20 mg by mouth every morning.    . psyllium (METAMUCIL) 58.6 % powder Take 1 packet by mouth every evening.    Marland Kitchen spironolactone (ALDACTONE) 25 MG tablet Take 1 tablet (25 mg total) by mouth  every morning. 90 tablet 3  . Testosterone Cypionate 200 MG/ML SOLN Inject 1 mL as directed every 14 (fourteen) days.    . traMADol (ULTRAM) 50 MG tablet Take 25-50 mg by mouth every 4 (four) hours as needed for moderate pain.     Marland Kitchen zolpidem (AMBIEN) 5 MG tablet Take 5-10 mg by mouth at bedtime as needed for sleep.  5   No current  facility-administered medications on file prior to visit.    PAST MEDICAL HISTORY: Past Medical History:  Diagnosis Date  . Arthritis    back, neck &hands   . Coronary artery calcification   . GERD (gastroesophageal reflux disease)   . Headache    controlled on Depakote  . High cholesterol   . History of hiatal hernia   . Hypertension   . Spondylolisthesis of lumbar region     PAST SURGICAL HISTORY: Past Surgical History:  Procedure Laterality Date  . BACK SURGERY    . EYE SURGERY     bot eyes- cataracts removed- /w IOL  . HEMORROIDECTOMY  1988  . HERNIA REPAIR  0981   umbilical hernia   . NECK SURGERY    . TONSILLECTOMY      FAMILY HISTORY: The patient's family history includes Heart disease in his father.   SOCIAL HISTORY:  The patient  reports that he quit smoking about 31 years ago. His smoking use included cigarettes. He quit after 10.00 years of use. He has never used smokeless tobacco. He reports previous alcohol use. He reports that he does not use drugs.  Review of Systems  Constitutional: Negative for chills and fever.  HENT: Negative for hoarse voice and nosebleeds.   Eyes: Negative for discharge, double vision and pain.  Cardiovascular: Negative for chest pain, claudication, dyspnea on exertion, leg swelling, near-syncope, orthopnea, palpitations, paroxysmal nocturnal dyspnea and syncope.  Respiratory: Negative for hemoptysis and shortness of breath.   Musculoskeletal: Negative for muscle cramps and myalgias.  Gastrointestinal: Negative for abdominal pain, constipation, diarrhea, hematemesis, hematochezia, melena, nausea and vomiting.  Neurological: Negative for dizziness and light-headedness.    PHYSICAL EXAM: Vitals with BMI 01/13/2020 11/12/2019 11/06/2018  Height $Remov'6\' 2"'YOpnFB$  $Remove'6\' 2"'RvBHCMl$  $RemoveB'6\' 2"'FKhPMukw$   Weight 272 lbs 273 lbs 270 lbs  BMI 34.91 19.14 78.29  Systolic 562 130 865  Diastolic 75 86 78  Pulse 63 72 74    CONSTITUTIONAL: Well-developed and well-nourished. No  acute distress.  SKIN: Skin is warm and dry. No rash noted. No cyanosis. No pallor. No jaundice HEAD: Normocephalic and atraumatic.  EYES: No scleral icterus MOUTH/THROAT: Moist oral membranes.  NECK: No JVD present. No thyromegaly noted. No carotid bruits  LYMPHATIC: No visible cervical adenopathy.  CHEST Normal respiratory effort. No intercostal retractions  LUNGS: Clear to auscultation bilaterally.  No stridor. No wheezes. No rales.  CARDIOVASCULAR: Regular rate and rhythm, positive S1-S2, no murmurs rubs or gallops appreciated. ABDOMINAL: Obese, soft, nontender, nondistended, positive bowel sounds in all 4 quadrants, no apparent ascites.  EXTREMITIES: No peripheral edema, 1+ bilateral dorsalis pedis and posterior tibial pulses. HEMATOLOGIC: No significant bruising NEUROLOGIC: Oriented to person, place, and time. Nonfocal. Normal muscle tone.  PSYCHIATRIC: Normal mood and affect. Normal behavior. Cooperative  RADIOLOGY: CTA Chest 02/28/2019 at [facility].  Mild atherosclerotic calcifications of the thoracic aorta without aneurysmal dilatation.  The sinus of Valsalva measures up to 4.4 cm. The sinotubular junction measures 3.4 cm. The ascending aorta measures 3.6 cm. The descending thoracic aorta measures 2.9 cm.  No  central pulmonary embolism.   CARDIAC DATABASE: EKG: 11/12/2019: Normal sinus rhythm, 67 bpm, normal axis, poor R wave progression, nonspecific T wave abnormality.  Echocardiogram: 12/30/2018: LVEF 08%, normal diastolic filling pattern, mildly dilated left atrium, mild MR, mild PR, aortic root 4.3 cm.  Stress Testing:  Lexiscan (Walking with mod Bruce) Sestamibi Stress Test 12/01/2019: Non-diagnostic ECG stress. due to pharmacologic stress. There is mild diaphragmatic attenuation in the inferior wall.  Myocardial perfusion is normal. Overall LV systolic function is mildly depressed without regional wall motion abnormalities. Stress LV EF: 47%.   Visually LVEF appears normal.  No previous exam available for comparison. Low risk.   Heart Catheterization: None  LABORATORY DATA: External Labs: Collected: 11/19/2019 Hemoglobin: 17.3 g/dL, hematocrit 51.2. Creatinine 0.81 mg/dL. eGFR: 99 mL/min per 1.73 m Lipid profile: Total cholesterol 111, triglycerides 83, HDL 30, LDL 64, non-HDL 81 Hemoglobin A1c: 5.7  IMPRESSION:    ICD-10-CM   1. Coronary atherosclerosis due to calcified coronary lesion of native artery  I25.10    I25.84   2. Dilatation of aortic sinus of Valsalva  Q25.44   3. Essential hypertension  I10   4. Hypercholesteremia  E78.00   5. Former smoker  Z87.891   59. Class 2 obesity due to excess calories without serious comorbidity with body mass index (BMI) of 35.0 to 35.9 in adult  E66.09    Z68.35   7. Family history of early CAD  Z64.49   72. Encounter to discuss test results  Z71.2      RECOMMENDATIONS: Daniel Villanueva is a 59 y.o. male whose past medical history and cardiovascular risk factors include: Coronary artery calcification, hypertension, hyperlipidemia, former smoker, obesity due to excess calories.  Coronary atherosclerosis due to coronary calcifications without angina pectoris:  Continue aspirin 81 mg p.o. daily.  Continue statin therapy.  Independently reviewed outside records including lipid profile.  His LDL is well controlled less than 70 mg/dL.  Patient's overall functional status remains stable.  Educated on the importance of risk factor modifications given his underlying coronary atherosclerosis.    Echo nuclear stress test results reviewed with him in great detail.  Findings noted above for further reference.  Benign essential hypertension: Well-controlled . Office blood pressure is at goal.  . Medication reconciled.  . Low salt diet recommended. A diet that is rich in fruits, vegetables, legumes, and low-fat dairy products and low in snacks, sweets, and meats (such as the Dietary  Approaches to Stop Hypertension [DASH] diet).  . Independently reviewed the labs that were performed at his PCPs office back in July.  Summarized results noted above.  Dyslipidemia: Continue statin therapy.  Does not endorse myalgias.  Encouraged increasing physical activity as tolerated to hopefully improve his HDL levels.  Continue to monitor.  Obesity, due to excess calories: Body mass index is 34.92 kg/m. . I reviewed with the patient the importance of diet, regular physical activity/exercise, weight loss.   . Patient is educated on increasing physical activity gradually as tolerated.  With the goal of moderate intensity exercise for 30 minutes a day 5 days a week.  FINAL MEDICATION LIST END OF ENCOUNTER: No orders of the defined types were placed in this encounter.    Current Outpatient Medications:  .  amLODipine (NORVASC) 5 MG tablet, TAKE 1 TABLET BY MOUTH DAILY, Disp: 90 tablet, Rfl: 1 .  atorvastatin (LIPITOR) 40 MG tablet, Take 1 tablet (40 mg total) by mouth at bedtime., Disp: 90 tablet, Rfl: 1 .  carvedilol (  COREG) 3.125 MG tablet, TAKE 1 TABLET(3.125 MG) BY MOUTH TWICE DAILY, Disp: 180 tablet, Rfl: 1 .  divalproex (DEPAKOTE) 250 MG DR tablet, Take 1 tablet (250 mg total) by mouth every 12 (twelve) hours. (Patient taking differently: Take 250 mg by mouth 2 (two) times daily. ), Disp: 60 tablet, Rfl: 0 .  fluticasone (FLONASE) 50 MCG/ACT nasal spray, Place 1 spray into both nostrils daily., Disp: , Rfl:  .  HYDROcodone-acetaminophen (NORCO) 10-325 MG tablet, Take 1 tablet by mouth every 6 (six) hours as needed., Disp: , Rfl:  .  losartan-hydrochlorothiazide (HYZAAR) 50-12.5 MG tablet, TAKE 1 TABLET BY MOUTH DAILY, Disp: 90 tablet, Rfl: 1 .  magnesium citrate SOLN, Take 1 Bottle by mouth once as needed for mild constipation or severe constipation., Disp: , Rfl:  .  omeprazole (PRILOSEC) 20 MG capsule, Take 20 mg by mouth every morning., Disp: , Rfl:  .  psyllium (METAMUCIL) 58.6 %  powder, Take 1 packet by mouth every evening., Disp: , Rfl:  .  spironolactone (ALDACTONE) 25 MG tablet, Take 1 tablet (25 mg total) by mouth every morning., Disp: 90 tablet, Rfl: 3 .  Testosterone Cypionate 200 MG/ML SOLN, Inject 1 mL as directed every 14 (fourteen) days., Disp: , Rfl:  .  traMADol (ULTRAM) 50 MG tablet, Take 25-50 mg by mouth every 4 (four) hours as needed for moderate pain. , Disp: , Rfl:  .  zolpidem (AMBIEN) 5 MG tablet, Take 5-10 mg by mouth at bedtime as needed for sleep., Disp: , Rfl: 5  No orders of the defined types were placed in this encounter.  --Continue cardiac medications as reconciled in final medication list. --Return in about 6 months (around 07/12/2020) for BP and coronary calcification follow up. . Or sooner if needed. --Continue follow-up with your primary care physician regarding the management of your other chronic comorbid conditions.  Patient's questions and concerns were addressed to his satisfaction. He voices understanding of the instructions provided during this encounter.   This note was created using a voice recognition software as a result there may be grammatical errors inadvertently enclosed that do not reflect the nature of this encounter. Every attempt is made to correct such errors.  Rex Kras, [place], [facility]  Pager: [phone] Office: [phone]

## 2020-01-13 ENCOUNTER — Other Ambulatory Visit: Payer: Self-pay

## 2020-01-13 ENCOUNTER — Ambulatory Visit: Payer: Managed Care, Other (non HMO) | Admitting: Cardiology

## 2020-01-13 ENCOUNTER — Encounter: Payer: Self-pay | Admitting: Cardiology

## 2020-01-13 VITALS — BP 124/75 | HR 63 | Resp 16 | Ht 74.0 in | Wt 272.0 lb

## 2020-01-13 DIAGNOSIS — Q2549 Other congenital malformations of aorta: Secondary | ICD-10-CM

## 2020-01-13 DIAGNOSIS — I2510 Atherosclerotic heart disease of native coronary artery without angina pectoris: Secondary | ICD-10-CM

## 2020-01-13 DIAGNOSIS — Z8249 Family history of ischemic heart disease and other diseases of the circulatory system: Secondary | ICD-10-CM

## 2020-01-13 DIAGNOSIS — E6609 Other obesity due to excess calories: Secondary | ICD-10-CM

## 2020-01-13 DIAGNOSIS — I2584 Coronary atherosclerosis due to calcified coronary lesion: Secondary | ICD-10-CM

## 2020-01-13 DIAGNOSIS — Z712 Person consulting for explanation of examination or test findings: Secondary | ICD-10-CM

## 2020-01-13 DIAGNOSIS — Z87891 Personal history of nicotine dependence: Secondary | ICD-10-CM

## 2020-01-13 DIAGNOSIS — I10 Essential (primary) hypertension: Secondary | ICD-10-CM

## 2020-01-13 DIAGNOSIS — Z6835 Body mass index (BMI) 35.0-35.9, adult: Secondary | ICD-10-CM

## 2020-01-13 DIAGNOSIS — E7800 Pure hypercholesterolemia, unspecified: Secondary | ICD-10-CM

## 2020-02-05 ENCOUNTER — Other Ambulatory Visit: Payer: Self-pay | Admitting: Cardiology

## 2020-04-19 ENCOUNTER — Other Ambulatory Visit: Payer: Self-pay | Admitting: Neurosurgery

## 2020-04-19 DIAGNOSIS — M5410 Radiculopathy, site unspecified: Secondary | ICD-10-CM

## 2020-04-22 ENCOUNTER — Other Ambulatory Visit: Payer: Self-pay | Admitting: Cardiology

## 2020-04-22 DIAGNOSIS — E7800 Pure hypercholesterolemia, unspecified: Secondary | ICD-10-CM

## 2020-05-18 ENCOUNTER — Other Ambulatory Visit: Payer: Managed Care, Other (non HMO)

## 2020-06-04 ENCOUNTER — Other Ambulatory Visit: Payer: Managed Care, Other (non HMO)

## 2020-06-04 ENCOUNTER — Ambulatory Visit
Admission: RE | Admit: 2020-06-04 | Discharge: 2020-06-04 | Disposition: A | Discharge status: Home / Self Care | Payer: Managed Care, Other (non HMO) | Source: Ambulatory Visit | Attending: Neurosurgery | Admitting: Neurosurgery

## 2020-06-04 DIAGNOSIS — M5410 Radiculopathy, site unspecified: Secondary | ICD-10-CM

## 2020-06-29 ENCOUNTER — Other Ambulatory Visit: Payer: Self-pay | Admitting: Cardiology

## 2020-06-29 DIAGNOSIS — I10 Essential (primary) hypertension: Secondary | ICD-10-CM

## 2020-07-12 ENCOUNTER — Ambulatory Visit: Payer: Managed Care, Other (non HMO) | Admitting: Cardiology

## 2020-07-27 ENCOUNTER — Encounter: Payer: Self-pay | Admitting: Cardiology

## 2020-07-27 ENCOUNTER — Ambulatory Visit: Payer: Managed Care, Other (non HMO) | Admitting: Cardiology

## 2020-07-27 ENCOUNTER — Other Ambulatory Visit: Payer: Self-pay

## 2020-07-27 VITALS — BP 136/86 | HR 67 | Temp 97.7°F | Resp 16 | Ht 74.0 in | Wt 260.6 lb

## 2020-07-27 DIAGNOSIS — Z87891 Personal history of nicotine dependence: Secondary | ICD-10-CM

## 2020-07-27 DIAGNOSIS — I2510 Atherosclerotic heart disease of native coronary artery without angina pectoris: Secondary | ICD-10-CM

## 2020-07-27 DIAGNOSIS — I10 Essential (primary) hypertension: Secondary | ICD-10-CM

## 2020-07-27 DIAGNOSIS — Z8249 Family history of ischemic heart disease and other diseases of the circulatory system: Secondary | ICD-10-CM

## 2020-07-27 DIAGNOSIS — Q2549 Other congenital malformations of aorta: Secondary | ICD-10-CM

## 2020-07-27 DIAGNOSIS — E66811 Obesity, class 1: Secondary | ICD-10-CM

## 2020-07-27 DIAGNOSIS — E7800 Pure hypercholesterolemia, unspecified: Secondary | ICD-10-CM

## 2020-07-27 DIAGNOSIS — E6609 Other obesity due to excess calories: Secondary | ICD-10-CM

## 2020-07-27 MED ORDER — ASPIRIN EC 81 MG PO TBEC
81.0000 mg | DELAYED_RELEASE_TABLET | Freq: Every day | ORAL | 3 refills | Status: DC
Start: 2020-07-27 — End: 2023-11-01

## 2020-07-27 MED ORDER — LOSARTAN POTASSIUM 100 MG PO TABS: 100.0000 mg | ORAL_TABLET | Freq: Every evening | ORAL | 0 refills | Status: DC

## 2020-07-27 NOTE — Progress Notes (Signed)
Daniel Villanueva Date of Birth: 07-Mar-1961 MRN: [id number] Primary Care Provider:Millsaps, Joelene Millin, NP Former Cardiology Providers: Jeri Lager, APRN, FNP-C Primary Cardiologist: Rex Kras, DO, [facility] (established care 11/12/2019)  Date: 07/27/20 Last Office Visit: 01/13/2020  Chief Complaint  Patient presents with  . Coronary atherosclerosis due to calcified coronary lesion o  . Follow-up  . Hypertension    HPI  Daniel Villanueva is a 60 y.o.  male who presents to the office with a chief complaint of " 27-monthfollow-up for coronary artery calcification and blood pressure management." Patient's past medical history and cardiovascular risk factors include: Coronary artery calcification, hypertension, hyperlipidemia, former smoker, obesity due to excess calories.  Patient is referred to the office for evaluation and management of blood pressure, and aortic root dilatation.  [facility] and per report was noted to have aortic root dilatation without evidence of thoracic aortic aneurysm.  But the sinus of Valsalva was measuring 4.4 cm.  He was also noted to have coronary artery calcification.  After establishing care patient did undergo stress test which was overall a low risk study.  However given the coronary artery calcification on nongated CT, risk factors, shared decision was to start aspirin and continue statin therapy.  Patient is tolerating therapy well without any side effects or intolerances.  He now presents for 675-monthollow-up.  He denies any recent hospitalizations or urgent care visits for cardiovascular symptoms.  Patient states that his home blood pressures are usually very well controlled at 125/84 mmHg and intermittently numbers have been as high as 140/90 mmHg  Since last office visit patient has also lost 12 pounds with lifestyle modifications.  FUNCTIONAL STATUS: Swims 2-3 x per week.     ALLERGIES: Allergies  Allergen Reactions  . Adhesive [Tape] Dermatitis    Skin irritation      MEDICATION LIST PRIOR TO VISIT: Current Outpatient Medications on File Prior to Visit  Medication Sig Dispense Refill  . atorvastatin (LIPITOR) 40 MG tablet TAKE 1 TABLET(40 MG) BY MOUTH AT BEDTIME 90 tablet 1  . carvedilol (COREG) 3.125 MG tablet TAKE 1 TABLET(3.125 MG) BY MOUTH TWICE DAILY WITH A MEAL 180 tablet 1  . divalproex (DEPAKOTE) 250 MG DR tablet Take 1 tablet (250 mg total) by mouth every 12 (twelve) hours. (Patient taking differently: Take 250 mg by mouth 2 (two) times daily.) 60 tablet 0  . fluticasone (FLONASE) 50 MCG/ACT nasal spray Place 1 spray into both nostrils daily.    . Marland KitchenYDROcodone-acetaminophen (NORCO) 10-325 MG tablet Take 1 tablet by mouth every 6 (six) hours as needed.    . magnesium citrate SOLN Take 1 Bottle by mouth once as needed for mild constipation or severe constipation.    . Marland Kitchenmeprazole (PRILOSEC) 20 MG capsule Take 20 mg by mouth every morning.    . psyllium (METAMUCIL) 58.6 % powder Take 1 packet by mouth every evening.    . Marland Kitchenpironolactone (ALDACTONE) 25 MG tablet TAKE 1 TABLET(25 MG) BY MOUTH EVERY MORNING 90 tablet 3  . traMADol (ULTRAM) 50 MG tablet Take 25-50 mg by mouth every 4 (four) hours as needed for moderate pain.     . Marland Kitchenolpidem (AMBIEN) 5 MG tablet Take 5-10 mg by mouth at bedtime as needed for sleep.  5   No current facility-administered medications on file prior to visit.    PAST MEDICAL HISTORY: Past Medical History:  Diagnosis Date  . Arthritis  back, neck &hands   . Coronary artery calcification   . GERD (gastroesophageal reflux disease)   . Headache    controlled on Depakote  . High cholesterol   . History of hiatal hernia   . Hypertension   . Spondylolisthesis of lumbar region     PAST SURGICAL HISTORY: Past Surgical History:  Procedure Laterality Date  . BACK SURGERY    . EYE SURGERY     bot eyes- cataracts  removed- /w IOL  . HEMORROIDECTOMY  1988  . HERNIA REPAIR  8099   umbilical hernia   . NECK SURGERY    . TONSILLECTOMY      FAMILY HISTORY: The patient's family history includes Heart disease in his father.   SOCIAL HISTORY:  The patient  reports that he quit smoking about 32 years ago. His smoking use included cigarettes. He quit after 10.00 years of use. He has never used smokeless tobacco. He reports previous alcohol use. He reports that he does not use drugs.  Review of Systems  Constitutional: Negative for chills and fever.  HENT: Negative for hoarse voice and nosebleeds.   Eyes: Negative for discharge, double vision and pain.  Cardiovascular: Negative for chest pain, claudication, dyspnea on exertion, leg swelling, near-syncope, orthopnea, palpitations, paroxysmal nocturnal dyspnea and syncope.  Respiratory: Negative for hemoptysis and shortness of breath.   Musculoskeletal: Negative for muscle cramps and myalgias.  Gastrointestinal: Negative for abdominal pain, constipation, diarrhea, hematemesis, hematochezia, melena, nausea and vomiting.  Neurological: Negative for dizziness and light-headedness.    PHYSICAL EXAM: Vitals with BMI 07/27/2020 01/13/2020 11/12/2019  Height _0  _1  _2   Weight 260 lbs 10 oz 272 lbs 273 lbs  BMI 33.44 83.38 25.05  Systolic 397 673 419  Diastolic 86 75 86  Pulse 67 63 72    CONSTITUTIONAL: Well-developed and well-nourished. No acute distress.  SKIN: Skin is warm and dry. No rash noted. No cyanosis. No pallor. No jaundice HEAD: Normocephalic and atraumatic.  EYES: No scleral icterus MOUTH/THROAT: Moist oral membranes.  NECK: No JVD present. No thyromegaly noted. No carotid bruits  LYMPHATIC: No visible cervical adenopathy.  CHEST Normal respiratory effort. No intercostal retractions  LUNGS: Clear to auscultation bilaterally.  No stridor. No wheezes. No rales.  CARDIOVASCULAR: Regular rate and rhythm, positive S1-S2, no murmurs rubs or  gallops appreciated. ABDOMINAL: Obese, soft, nontender, nondistended, positive bowel sounds in all 4 quadrants, no apparent ascites.  EXTREMITIES: No peripheral edema, 1+ bilateral dorsalis pedis and posterior tibial pulses. HEMATOLOGIC: No significant bruising NEUROLOGIC: Oriented to person, place, and time. Nonfocal. Normal muscle tone.  PSYCHIATRIC: Normal mood and affect. Normal behavior. Cooperative  RADIOLOGY: CTA Chest 02/28/2019 at [facility].  Mild atherosclerotic calcifications of the thoracic aorta without aneurysmal dilatation.  The sinus of Valsalva measures up to 4.4 cm. The sinotubular junction measures 3.4 cm. The ascending aorta measures 3.6 cm. The descending thoracic aorta measures 2.9 cm.  No central pulmonary embolism.   CARDIAC DATABASE: EKG: 11/12/2019: Normal sinus rhythm, 67 bpm, normal axis, poor R wave progression, nonspecific T wave abnormality.  Echocardiogram: 12/30/2018: LVEF 37%, normal diastolic filling pattern, mildly dilated left atrium, mild MR, mild PR, aortic root 4.3 cm.  Stress Testing:  Lexiscan (Walking with mod Bruce) Sestamibi Stress Test 12/01/2019: Non-diagnostic ECG stress. due to pharmacologic stress. There is mild diaphragmatic attenuation in the inferior wall.  Myocardial perfusion is normal. Overall LV systolic function is mildly depressed without regional wall motion abnormalities. Stress LV EF:  47%.  Visually LVEF appears normal.  No previous exam available for comparison. Low risk.   Heart Catheterization: None  LABORATORY DATA: External Labs: Collected: 11/19/2019 Hemoglobin: 17.3 g/dL, hematocrit 51.2. Creatinine 0.81 mg/dL. eGFR: 99 mL/min per 1.73 m Lipid profile: Total cholesterol 111, triglycerides 83, HDL 30, LDL 64, non-HDL 81 Hemoglobin A1c: 5.7  IMPRESSION:    ICD-10-CM   1. Coronary atherosclerosis due to calcified coronary lesion of native artery  I25.10 EKG 12-Lead   I25.84 aspirin EC 81 MG  tablet  2. Dilatation of aortic sinus of Valsalva  Q25.49 PCV ECHOCARDIOGRAM COMPLETE  3. Family history of early CAD  Z79.49   42. Essential hypertension  I10 losartan (COZAAR) 100 MG tablet    Basic metabolic panel    Magnesium  5. Hypercholesteremia  E78.00   6. Former smoker  Z87.891   50. Class 1 obesity due to excess calories without serious comorbidity with body mass index (BMI) of 33.0 to 33.9 in adult  E66.09    Z68.33      RECOMMENDATIONS: Daniel Villanueva is a 60 y.o. male whose past medical history and cardiovascular risk factors include: Coronary artery calcification, hypertension, hyperlipidemia, former smoker, obesity due to excess calories.  Coronary atherosclerosis due to coronary calcifications without angina pectoris:  Continue aspirin and statin therapy.  Patient has undergone ischemic evaluation in the recent past.  Results noted above.  No chest pain or anginal discomfort since last office visit.  No additional cardiovascular testing needed at this time.    Patient is asked to bring in his fasting lipid profile at the next office visit that will be performed with his yearly physical with his PCP.   Dilatation of sinus of Valsalva:  Plan echocardiogram prior to the upcoming office visit to reevaluate progression  Educated on the importance of blood pressure management.  Benign essential hypertension:  . Office blood pressure is within acceptable range. . Home blood pressure is well controlled. . Patient is on multiple antihypertensive medications and not at maximal doses.  To improve the number of pills that he is taking we will consolidate his antihypertensive medications. . Discontinue losartan/hydrochlorothiazide. . Blood work in 1 week to evaluate kidney function and electrolytes. . Low salt diet recommended. A diet that is rich in fruits, vegetables, legumes, and low-fat dairy products and low in snacks, sweets, and meats (such as the Dietary Approaches to  Stop Hypertension [DASH] diet).   Dyslipidemia: Continue statin therapy.  Does not endorse myalgias.  Encouraged increasing physical activity as tolerated to hopefully improve his HDL levels.  Continue to monitor.  Obesity, due to excess calories: Body mass index is 33.46 kg/m.  Patient has lost approximately 12 pounds since last office visit, he is congratulated on his efforts. . I reviewed with the patient the importance of diet, regular physical activity/exercise, weight loss.   . Patient is educated on increasing physical activity gradually as tolerated.  With the goal of moderate intensity exercise for 30 minutes a day 5 days a week.  FINAL MEDICATION LIST END OF ENCOUNTER: Meds ordered this encounter  Medications  . losartan (COZAAR) 100 MG tablet    Sig: Take 1 tablet (100 mg total) by mouth every evening.    Dispense:  30 tablet    Refill:  0  . aspirin EC 81 MG tablet    Sig: Take 1 tablet (81 mg total) by mouth daily. Swallow whole.    Dispense:  90 tablet    Refill:  3     Current Outpatient Medications:  .  aspirin EC 81 MG tablet, Take 1 tablet (81 mg total) by mouth daily. Swallow whole., Disp: 90 tablet, Rfl: 3 .  atorvastatin (LIPITOR) 40 MG tablet, TAKE 1 TABLET(40 MG) BY MOUTH AT BEDTIME, Disp: 90 tablet, Rfl: 1 .  carvedilol (COREG) 3.125 MG tablet, TAKE 1 TABLET(3.125 MG) BY MOUTH TWICE DAILY WITH A MEAL, Disp: 180 tablet, Rfl: 1 .  divalproex (DEPAKOTE) 250 MG DR tablet, Take 1 tablet (250 mg total) by mouth every 12 (twelve) hours. (Patient taking differently: Take 250 mg by mouth 2 (two) times daily.), Disp: 60 tablet, Rfl: 0 .  fluticasone (FLONASE) 50 MCG/ACT nasal spray, Place 1 spray into both nostrils daily., Disp: , Rfl:  .  HYDROcodone-acetaminophen (NORCO) 10-325 MG tablet, Take 1 tablet by mouth every 6 (six) hours as needed., Disp: , Rfl:  .  losartan (COZAAR) 100 MG tablet, Take 1 tablet (100 mg total) by mouth every evening., Disp: 30 tablet, Rfl:  0 .  magnesium citrate SOLN, Take 1 Bottle by mouth once as needed for mild constipation or severe constipation., Disp: , Rfl:  .  omeprazole (PRILOSEC) 20 MG capsule, Take 20 mg by mouth every morning., Disp: , Rfl:  .  psyllium (METAMUCIL) 58.6 % powder, Take 1 packet by mouth every evening., Disp: , Rfl:  .  spironolactone (ALDACTONE) 25 MG tablet, TAKE 1 TABLET(25 MG) BY MOUTH EVERY MORNING, Disp: 90 tablet, Rfl: 3 .  traMADol (ULTRAM) 50 MG tablet, Take 25-50 mg by mouth every 4 (four) hours as needed for moderate pain. , Disp: , Rfl:  .  zolpidem (AMBIEN) 5 MG tablet, Take 5-10 mg by mouth at bedtime as needed for sleep., Disp: , Rfl: 5  Orders Placed This Encounter  Procedures  . Basic metabolic panel  . Magnesium  . EKG 12-Lead  . PCV ECHOCARDIOGRAM COMPLETE   --Continue cardiac medications as reconciled in final medication list. --Return in about 6 months (around 02/07/2021) for Follow up, BP. Or sooner if needed. --Continue follow-up with your primary care physician regarding the management of your other chronic comorbid conditions.  Patient's questions and concerns were addressed to his satisfaction. He voices understanding of the instructions provided during this encounter.   This note was created using a voice recognition software as a result there may be grammatical errors inadvertently enclosed that do not reflect the nature of this encounter. Every attempt is made to correct such errors.  Rex Kras, [place], [facility]  Pager: [phone] Office: [phone]

## 2020-07-29 ENCOUNTER — Other Ambulatory Visit: Payer: Self-pay

## 2020-07-29 DIAGNOSIS — I10 Essential (primary) hypertension: Secondary | ICD-10-CM

## 2020-08-25 ENCOUNTER — Other Ambulatory Visit: Payer: Self-pay | Admitting: Cardiology

## 2020-09-14 ENCOUNTER — Other Ambulatory Visit: Payer: Self-pay | Admitting: Cardiology

## 2020-09-14 DIAGNOSIS — I10 Essential (primary) hypertension: Secondary | ICD-10-CM

## 2020-09-24 ENCOUNTER — Other Ambulatory Visit: Payer: Self-pay | Admitting: Cardiology

## 2020-09-24 DIAGNOSIS — I10 Essential (primary) hypertension: Secondary | ICD-10-CM

## 2020-10-13 ENCOUNTER — Other Ambulatory Visit: Payer: Self-pay | Admitting: Cardiology

## 2020-10-13 DIAGNOSIS — I10 Essential (primary) hypertension: Secondary | ICD-10-CM

## 2020-11-15 ENCOUNTER — Other Ambulatory Visit: Payer: Self-pay | Admitting: Cardiology

## 2020-11-15 DIAGNOSIS — I10 Essential (primary) hypertension: Secondary | ICD-10-CM

## 2020-11-15 NOTE — Telephone Encounter (Signed)
Should pt be on this?

## 2020-11-19 ENCOUNTER — Other Ambulatory Visit: Payer: Self-pay

## 2020-11-19 DIAGNOSIS — I10 Essential (primary) hypertension: Secondary | ICD-10-CM

## 2020-11-19 NOTE — Telephone Encounter (Signed)
I dont have him on this as per the last office note.  Did someone else start him on it?  How are his BP?

## 2020-11-23 NOTE — Telephone Encounter (Signed)
Patient isn't taking medication he got confuse because his pharmacy had an old refill and they filled it. Patient states his bp has been good he had no readings therefore wasn't able to give me any

## 2020-12-15 ENCOUNTER — Other Ambulatory Visit: Payer: Self-pay

## 2020-12-15 ENCOUNTER — Ambulatory Visit: Payer: Managed Care, Other (non HMO)

## 2020-12-15 DIAGNOSIS — Q2549 Other congenital malformations of aorta: Secondary | ICD-10-CM

## 2020-12-19 ENCOUNTER — Other Ambulatory Visit: Payer: Self-pay | Admitting: Cardiology

## 2020-12-19 DIAGNOSIS — E7800 Pure hypercholesterolemia, unspecified: Secondary | ICD-10-CM

## 2021-01-05 ENCOUNTER — Other Ambulatory Visit: Payer: Self-pay | Admitting: Cardiology

## 2021-01-05 DIAGNOSIS — Z8249 Family history of ischemic heart disease and other diseases of the circulatory system: Secondary | ICD-10-CM

## 2021-01-05 DIAGNOSIS — I2510 Atherosclerotic heart disease of native coronary artery without angina pectoris: Secondary | ICD-10-CM

## 2021-01-05 DIAGNOSIS — Q2549 Other congenital malformations of aorta: Secondary | ICD-10-CM

## 2021-01-05 DIAGNOSIS — I10 Essential (primary) hypertension: Secondary | ICD-10-CM

## 2021-01-05 NOTE — Progress Notes (Signed)
Called and spoke with patient regarding his echocardiogram results.   Front staff: called transferred to front staff (DL) to schedule CT and FU appointment

## 2021-01-05 NOTE — Progress Notes (Signed)
CT ches

## 2021-01-21 NOTE — Progress Notes (Signed)
Please FU with patient about scheduling CT. Thank you.

## 2021-01-23 ENCOUNTER — Other Ambulatory Visit: Payer: Self-pay | Admitting: Cardiology

## 2021-01-23 DIAGNOSIS — I10 Essential (primary) hypertension: Secondary | ICD-10-CM

## 2021-02-03 NOTE — Progress Notes (Signed)
I spoke to Daniel Villanueva she said GI has been trying to contact him to get him scheduled patient does not answer. I tried calling myself all the phone numbers on his chart and no on picked up.

## 2021-02-17 ENCOUNTER — Other Ambulatory Visit: Payer: Self-pay | Admitting: Cardiology

## 2021-02-17 DIAGNOSIS — I10 Essential (primary) hypertension: Secondary | ICD-10-CM

## 2021-05-10 ENCOUNTER — Other Ambulatory Visit: Payer: Self-pay | Admitting: Cardiology

## 2021-05-10 DIAGNOSIS — I10 Essential (primary) hypertension: Secondary | ICD-10-CM

## 2021-06-13 ENCOUNTER — Other Ambulatory Visit: Payer: Self-pay | Admitting: Cardiology

## 2021-06-13 DIAGNOSIS — E7800 Pure hypercholesterolemia, unspecified: Secondary | ICD-10-CM

## 2021-07-12 ENCOUNTER — Other Ambulatory Visit: Payer: Self-pay | Admitting: Cardiology

## 2021-07-12 DIAGNOSIS — E7800 Pure hypercholesterolemia, unspecified: Secondary | ICD-10-CM

## 2021-08-02 ENCOUNTER — Encounter: Payer: Self-pay | Admitting: Cardiology

## 2021-08-02 ENCOUNTER — Ambulatory Visit: Payer: Managed Care, Other (non HMO) | Admitting: Cardiology

## 2021-08-02 ENCOUNTER — Other Ambulatory Visit: Payer: Self-pay

## 2021-08-02 VITALS — BP 155/88 | HR 55 | Temp 98.2°F | Resp 10 | Ht 74.0 in | Wt 253.2 lb

## 2021-08-02 DIAGNOSIS — E7800 Pure hypercholesterolemia, unspecified: Secondary | ICD-10-CM

## 2021-08-02 DIAGNOSIS — I2510 Atherosclerotic heart disease of native coronary artery without angina pectoris: Secondary | ICD-10-CM

## 2021-08-02 DIAGNOSIS — Z8249 Family history of ischemic heart disease and other diseases of the circulatory system: Secondary | ICD-10-CM

## 2021-08-02 DIAGNOSIS — Z87891 Personal history of nicotine dependence: Secondary | ICD-10-CM

## 2021-08-02 DIAGNOSIS — Q2549 Other congenital malformations of aorta: Secondary | ICD-10-CM

## 2021-08-02 DIAGNOSIS — I10 Essential (primary) hypertension: Secondary | ICD-10-CM

## 2021-08-02 MED ORDER — AMLODIPINE BESYLATE 5 MG PO TABS: 5.0000 mg | ORAL_TABLET | Freq: Every morning | ORAL | 0 refills | Status: DC

## 2021-08-02 NOTE — Progress Notes (Signed)
? ?Daniel Villanueva ?Date of Birth: [birth year] ?MRN: [id number] ?Primary Care Provider:Millsaps, Joelene Millin, NP ?Former Cardiology Providers: Jeri Lager, APRN, FNP-C ?Primary Cardiologist: Rex Kras, DO, [facility] (established care 11/12/2019) ? ?Date: 08/02/21 ?Last Office Visit: 07/27/2020 ? ?Chief Complaint  ?Patient presents with  ? Follow-up  ?  Blood pressure, history of aortic root dilatation, discussed echo results  ? ? ?HPI  ?Daniel Villanueva is a 61 y.o.  male whose past medical history and cardiovascular risk factors include: Coronary artery calcification, hypertension, hyperlipidemia, former smoker, obesity due to excess calories. ? ?Patient is referred to the office for evaluation and management of blood pressure, and aortic root dilatation. ? ?In October 2022 he had a CT chest at [facility] which noted aortic root dilatation without evidence of aortic aneurysm.  Sinus of Valsalva measured 4.4 cm noted to have coronary artery calcification. ? ?Patient had appropriate ischemic work-up and his aortic root dilatation is currently being followed by surface echocardiogram.  He had his last echo in August 2022 which notes dilatation of the aortic root.  We will try to reach out to the patient but he had lost to follow-up.  Recently we were able to reach out and arrange this office visit.  Clinically patient denies any chest pain or heart failure symptoms.  Patient states that his home blood pressures range between 120-140 mmHg and heart rate 60-71 bpm.  He is significantly cut down the amount of alcohol consumption and has lost weight since last visit with lifestyle modifications. ? ?FUNCTIONAL STATUS: No structured exercise program or daily routine. ? ? ?ALLERGIES: ?Allergies  ?Allergen Reactions  ? Adhesive [Tape] Dermatitis  ?  Skin irritation   ? ? ? ?MEDICATION LIST PRIOR TO VISIT: ?Current Outpatient Medications on File Prior to Visit  ?Medication Sig Dispense Refill  ? aspirin EC 81 MG  tablet Take 1 tablet (81 mg total) by mouth daily. Swallow whole. 90 tablet 3  ? atorvastatin (LIPITOR) 40 MG tablet TAKE 1 TABLET(40 MG) BY MOUTH AT BEDTIME 30 tablet 0  ? carvedilol (COREG) 3.125 MG tablet TAKE 1 TABLET(3.125 MG) BY MOUTH TWICE DAILY WITH A MEAL 180 tablet 1  ? divalproex (DEPAKOTE) 250 MG DR tablet Take 1 tablet (250 mg total) by mouth every 12 (twelve) hours. (Patient taking differently: Take 250 mg by mouth 2 (two) times daily.) 60 tablet 0  ? fluticasone (FLONASE) 50 MCG/ACT nasal spray Place 1 spray into both nostrils daily.    ? HYDROcodone-acetaminophen (NORCO) 10-325 MG tablet Take 1 tablet by mouth every 6 (six) hours as needed.    ? losartan (COZAAR) 100 MG tablet TAKE 1 TABLET(100 MG) BY MOUTH EVERY EVENING 90 tablet 0  ? magnesium citrate SOLN Take 1 Bottle by mouth once as needed for mild constipation or severe constipation.    ? omeprazole (PRILOSEC) 20 MG capsule Take 20 mg by mouth every morning.    ? psyllium (METAMUCIL) 58.6 % powder Take 1 packet by mouth every evening.    ? spironolactone (ALDACTONE) 25 MG tablet TAKE 1 TABLET(25 MG) BY MOUTH EVERY MORNING 90 tablet 3  ? traMADol (ULTRAM) 50 MG tablet Take 25-50 mg by mouth every 4 (four) hours as needed for moderate pain.     ? zolpidem (AMBIEN) 5 MG tablet Take 5-10 mg by mouth at bedtime as needed for sleep.  5  ? ?No current facility-administered medications on file prior to visit.  ? ? ?PAST MEDICAL HISTORY: ?Past Medical History:  ?  Diagnosis Date  ? Arthritis   ? back, neck &hands   ? Coronary artery calcification   ? GERD (gastroesophageal reflux disease)   ? Headache   ? controlled on Depakote  ? High cholesterol   ? History of hiatal hernia   ? Hypertension   ? Spondylolisthesis of lumbar region   ? ? ?PAST SURGICAL HISTORY: ?Past Surgical History:  ?Procedure Laterality Date  ? BACK SURGERY    ? EYE SURGERY    ? bot eyes- cataracts removed- /w IOL  ? HEMORROIDECTOMY  1988  ? HERNIA REPAIR  2952  ? umbilical hernia   ?  NECK SURGERY    ? TONSILLECTOMY    ? ? ?FAMILY HISTORY: ?The patient's family history includes Heart disease in his father. ?  ?SOCIAL HISTORY:  ?The patient  reports that he quit smoking about 33 years ago. His smoking use included cigarettes. He has never used smokeless tobacco. He reports that he does not currently use alcohol. He reports that he does not use drugs. ? ?Review of Systems  ?Cardiovascular:  Negative for chest pain, claudication, dyspnea on exertion, leg swelling, near-syncope, orthopnea, palpitations, paroxysmal nocturnal dyspnea and syncope.  ?Respiratory:  Negative for shortness of breath.   ? ?PHYSICAL EXAM: ? ?  08/02/2021  ? 11:50 AM 07/27/2020  ?  2:57 PM 01/13/2020  ?  9:32 AM  ?Vitals with BMI  ?Height _0  _1  _2   ?Weight 253 lbs 3 oz 260 lbs 10 oz 272 lbs  ?BMI 32.5 33.44 34.91  ?Systolic 841 324 401  ?Diastolic 88 86 75  ?Pulse 55 67 63  ? ? ?CONSTITUTIONAL: Well-developed and well-nourished. No acute distress.  ?SKIN: Skin is warm and dry. No rash noted. No cyanosis. No pallor. No jaundice ?HEAD: Normocephalic and atraumatic.  ?EYES: No scleral icterus ?MOUTH/THROAT: Moist oral membranes.  ?NECK: No JVD present. No thyromegaly noted. No carotid bruits  ?LYMPHATIC: No visible cervical adenopathy.  ?CHEST Normal respiratory effort. No intercostal retractions  ?LUNGS: Clear to auscultation bilaterally.  No stridor. No wheezes. No rales.  ?CARDIOVASCULAR: Regular rate and rhythm, positive S1-S2, no murmurs rubs or gallops appreciated. ?ABDOMINAL: Obese, soft, nontender, nondistended, positive bowel sounds in all 4 quadrants, no apparent ascites.  ?EXTREMITIES: No peripheral edema, 2+ DP and PT pulses, warm to touch. ?HEMATOLOGIC: No significant bruising ?NEUROLOGIC: Oriented to person, place, and time. Nonfocal. Normal muscle tone.  ?PSYCHIATRIC: Normal mood and affect. Normal behavior. Cooperative ? ?RADIOLOGY: ?[facility] Chest 02/28/2019 at [facility].  ?Mild atherosclerotic  calcifications of the thoracic aorta without aneurysmal dilatation.  ?The sinus of Valsalva measures up to 4.4 cm. The sinotubular junction measures 3.4 cm. The ascending aorta measures 3.6 cm. The descending thoracic aorta measures 2.9 cm.  ?No central pulmonary embolism.  ? ?CARDIAC DATABASE: ?EKG: ?11/12/2019: Normal sinus rhythm, 67 bpm, normal axis, poor R wave progression, nonspecific T wave abnormality. ? ?Echocardiogram: ?12/30/2018: LVEF 02%, normal diastolic filling pattern, mildly dilated left atrium, mild MR, mild PR, aortic root 4.3 cm. ? ?12/15/2020: LVEF 64%, mild LVH, normal diastolic pattern, normal LAP, mild MR. The aortic root is dilated (Sinus of Valsalva 4.59cm and Sinotubular junction 4.22cm). Proximal ascending aorta not well visualized.  ? ?Stress Testing:  ?Lexiscan (Walking with mod Bruce) Sestamibi Stress Test 12/01/2019: ?Non-diagnostic ECG stress. due to pharmacologic stress. ?There is mild diaphragmatic attenuation in the inferior wall.  ?Myocardial perfusion is normal. ?Overall LV systolic function is mildly depressed without regional wall motion abnormalities.  Stress LV EF: 47%.  Visually LVEF appears normal.  ?No previous exam available for comparison. Low risk.  ? ?Heart Catheterization: ?None ? ?LABORATORY DATA: ?External Labs: ?Collected: 11/19/2019 ?Hemoglobin: 17.3 g/dL, hematocrit 51.2. ?Creatinine 0.81 mg/dL. ?eGFR: 99 mL/min per 1.73 m? ?Lipid profile: Total cholesterol 111, triglycerides 83, HDL 30, LDL 64, non-HDL 81 ?Hemoglobin A1c: 5.7 ? ?External labs: ?Collected: 07/13/2021 available in Care Everywhere. ?Sodium 138, potassium 4.3, chloride 104, bicarb 26, BUN 7, creatinine 0.7. ?AST 16, ALT 18 ?Hemoglobin 15.7 g/dL, hematocrit 46.4%. ? ?Collected 03/10/2021 Care Everywhere at PCP. ?Total cholesterol 101, triglycerides 83, LDL 45, HDL 39, non-HDL 62 ? ?IMPRESSION: ? ?  ICD-10-CM   ?1. Dilatation of aortic sinus of Valsalva  Q25.49 CT ANGIO CHEST AORTA W/ & OR WO/CM & GATING  ([place] ONLY)  ?  amLODipine (NORVASC) 5 MG tablet  ?  ?2. Coronary atherosclerosis due to calcified coronary lesion of native artery  I25.10   ? I25.84   ?  ?3. Essential hypertension  I10 CT ANGIO [facility]

## 2021-08-10 ENCOUNTER — Other Ambulatory Visit: Payer: Self-pay | Admitting: Cardiology

## 2021-08-10 DIAGNOSIS — E7800 Pure hypercholesterolemia, unspecified: Secondary | ICD-10-CM

## 2021-08-10 DIAGNOSIS — I10 Essential (primary) hypertension: Secondary | ICD-10-CM

## 2021-09-06 ENCOUNTER — Other Ambulatory Visit: Payer: Self-pay | Admitting: Cardiology

## 2021-09-06 DIAGNOSIS — E7800 Pure hypercholesterolemia, unspecified: Secondary | ICD-10-CM

## 2021-09-13 ENCOUNTER — Other Ambulatory Visit: Payer: Self-pay | Admitting: Cardiology

## 2021-09-13 DIAGNOSIS — I10 Essential (primary) hypertension: Secondary | ICD-10-CM

## 2021-10-04 ENCOUNTER — Other Ambulatory Visit: Payer: Self-pay | Admitting: Cardiology

## 2021-10-04 DIAGNOSIS — E7800 Pure hypercholesterolemia, unspecified: Secondary | ICD-10-CM

## 2021-11-01 ENCOUNTER — Other Ambulatory Visit: Payer: Self-pay | Admitting: Cardiology

## 2021-11-01 DIAGNOSIS — E7800 Pure hypercholesterolemia, unspecified: Secondary | ICD-10-CM

## 2021-11-01 DIAGNOSIS — I10 Essential (primary) hypertension: Secondary | ICD-10-CM

## 2021-11-02 ENCOUNTER — Other Ambulatory Visit: Payer: Self-pay | Admitting: Cardiology

## 2021-11-02 DIAGNOSIS — Q2549 Other congenital malformations of aorta: Secondary | ICD-10-CM

## 2021-11-02 DIAGNOSIS — I10 Essential (primary) hypertension: Secondary | ICD-10-CM

## 2021-11-03 ENCOUNTER — Other Ambulatory Visit (HOSPITAL_COMMUNITY): Payer: Managed Care, Other (non HMO)

## 2021-11-07 ENCOUNTER — Ambulatory Visit (HOSPITAL_COMMUNITY)
Admission: RE | Admit: 2021-11-07 | Discharge: 2021-11-07 | Disposition: A | Discharge status: Home / Self Care | Payer: Managed Care, Other (non HMO) | Source: Ambulatory Visit | Attending: Cardiology | Admitting: Cardiology

## 2021-11-07 DIAGNOSIS — Q2549 Other congenital malformations of aorta: Secondary | ICD-10-CM | POA: Diagnosis present

## 2021-11-07 DIAGNOSIS — I10 Essential (primary) hypertension: Secondary | ICD-10-CM | POA: Diagnosis present

## 2021-11-07 MED ORDER — IOHEXOL 350 MG/ML SOLN
98.0000 mL | Freq: Once | INTRAVENOUS | Status: AC | PRN
  Administered 2021-11-07: 98 mL via INTRAVENOUS

## 2021-11-11 NOTE — Progress Notes (Signed)
Called patient, NA, LMAM

## 2021-11-11 NOTE — Progress Notes (Signed)
Patient is aware and scheduled a follow up.

## 2021-11-15 ENCOUNTER — Ambulatory Visit: Payer: Managed Care, Other (non HMO) | Admitting: Cardiology

## 2021-11-22 ENCOUNTER — Encounter: Payer: Self-pay | Admitting: Cardiology

## 2021-11-22 ENCOUNTER — Ambulatory Visit: Payer: Managed Care, Other (non HMO) | Admitting: Cardiology

## 2021-11-22 VITALS — BP 138/82 | HR 72 | Temp 98.6°F | Resp 16 | Ht 74.0 in | Wt 257.8 lb

## 2021-11-22 DIAGNOSIS — I2510 Atherosclerotic heart disease of native coronary artery without angina pectoris: Secondary | ICD-10-CM

## 2021-11-22 DIAGNOSIS — I10 Essential (primary) hypertension: Secondary | ICD-10-CM

## 2021-11-22 DIAGNOSIS — Z8249 Family history of ischemic heart disease and other diseases of the circulatory system: Secondary | ICD-10-CM

## 2021-11-22 DIAGNOSIS — Q2549 Other congenital malformations of aorta: Secondary | ICD-10-CM

## 2021-11-22 DIAGNOSIS — Z87891 Personal history of nicotine dependence: Secondary | ICD-10-CM

## 2021-11-22 DIAGNOSIS — E7800 Pure hypercholesterolemia, unspecified: Secondary | ICD-10-CM

## 2021-11-22 DIAGNOSIS — E6609 Other obesity due to excess calories: Secondary | ICD-10-CM

## 2021-11-22 NOTE — Progress Notes (Signed)
Daniel Villanueva Date of Birth: October 21, 1960 MRN: [id number] Primary Care Provider:Millsaps, Joelene Millin, NP Former Cardiology Providers: Jeri Lager, APRN, FNP-C Primary Cardiologist: Rex Kras, DO, [facility] (established care 11/12/2019)  Date: 11/25/21 Last Office Visit: 08/02/2021  Chief Complaint  Patient presents with   Hypertension   Dilatation of aortic    Follow-up    HPI  Daniel Villanueva is a 61 y.o.  male whose past medical history and cardiovascular risk factors include: Coronary artery calcification, aortic root dilatation, hypertension, hyperlipidemia, former smoker, obesity due to excess calories.  In October 2022 he had a CT scan at [facility] which noted coronary artery calcification, an aortic root dilatation (sinus of Valsalva measured 44 mm) without evidence of aortic aneurysm.  Since last office visit he did undergo CTA chest with gating protocol and per report had dilated ascending thoracic aorta measuring 49 mm at the sinus of Valsalva.  Semiannual screening with either CTA or MRI is recommended.  Patient states that his blood pressures on current antihypertensive medications are well controlled with a home blood pressure averaging 125/85 mmHg.  He has not experienced any pain between the shoulder blades, and sudden onset of syncope or near syncope, vision changes.  He is significantly cut down his alcohol consumption and is working on lifestyle changes to facilitate weight loss.  FUNCTIONAL STATUS: No structured exercise program or daily routine.  ALLERGIES: Allergies  Allergen Reactions   Adhesive [Tape] Dermatitis    Skin irritation      MEDICATION LIST PRIOR TO VISIT: Current Outpatient Medications on File Prior to Visit  Medication Sig Dispense Refill   amLODipine (NORVASC) 5 MG tablet TAKE 1 TABLET(5 MG) BY MOUTH EVERY MORNING 90 tablet 0   aspirin EC 81 MG tablet Take 1 tablet (81 mg total) by mouth daily. Swallow whole. 90 tablet 3    atorvastatin (LIPITOR) 40 MG tablet TAKE 1 TABLET(40 MG) BY MOUTH AT BEDTIME 30 tablet 0   carvedilol (COREG) 3.125 MG tablet TAKE 1 TABLET(3.125 MG) BY MOUTH TWICE DAILY WITH A MEAL 180 tablet 1   divalproex (DEPAKOTE) 250 MG DR tablet Take 1 tablet (250 mg total) by mouth every 12 (twelve) hours. (Patient taking differently: Take 250 mg by mouth 2 (two) times daily.) 60 tablet 0   HYDROcodone-acetaminophen (NORCO) 10-325 MG tablet Take 1 tablet by mouth every 6 (six) hours as needed.     losartan (COZAAR) 100 MG tablet TAKE 1 TABLET(100 MG) BY MOUTH EVERY EVENING 90 tablet 0   omeprazole (PRILOSEC) 20 MG capsule Take 20 mg by mouth every morning.     psyllium (METAMUCIL) 58.6 % powder Take 1 packet by mouth every evening.     spironolactone (ALDACTONE) 25 MG tablet TAKE 1 TABLET(25 MG) BY MOUTH EVERY MORNING 90 tablet 3   traMADol (ULTRAM) 50 MG tablet Take 25-50 mg by mouth every 4 (four) hours as needed for moderate pain.      zolpidem (AMBIEN) 5 MG tablet Take 5-10 mg by mouth at bedtime as needed for sleep.  5   fluticasone (FLONASE) 50 MCG/ACT nasal spray Place 1 spray into both nostrils daily.     No current facility-administered medications on file prior to visit.    PAST MEDICAL HISTORY: Past Medical History:  Diagnosis Date   Arthritis    back, neck &hands    Coronary artery calcification    GERD (gastroesophageal reflux disease)    Headache    controlled on Depakote  High cholesterol    History of hiatal hernia    Hypertension    Spondylolisthesis of lumbar region     PAST SURGICAL HISTORY: Past Surgical History:  Procedure Laterality Date   BACK SURGERY     EYE SURGERY     bot eyes- cataracts removed- /w IOL   HEMORROIDECTOMY  1988   HERNIA REPAIR  [id number]   umbilical hernia    NECK SURGERY     TONSILLECTOMY      FAMILY HISTORY: The patient's family history includes Heart disease in his father.   SOCIAL HISTORY:  The patient  reports that he quit smoking  about 33 years ago. His smoking use included cigarettes. He has never used smokeless tobacco. He reports that he does not currently use alcohol. He reports that he does not use drugs.  Review of Systems  Cardiovascular:  Negative for chest pain, claudication, dyspnea on exertion, leg swelling, near-syncope, orthopnea, palpitations, paroxysmal nocturnal dyspnea and syncope.  Respiratory:  Negative for shortness of breath.     PHYSICAL EXAM:    11/22/2021    3:11 PM 08/02/2021   11:50 AM 07/27/2020    2:57 PM  Vitals with BMI  Height _0  _1  _2   Weight 257 lbs 13 oz 253 lbs 3 oz 260 lbs 10 oz  BMI 33.09 20.2 54.27  Systolic 062 376 283  Diastolic 82 88 86  Pulse 72 55 67    Physical Exam  Neck: No JVD present.  Cardiovascular: Normal rate, regular rhythm, S1 normal, S2 normal, intact distal pulses and normal pulses. Exam reveals no gallop, no S3 and no S4.  No murmur heard. Pulmonary/Chest: Effort normal and breath sounds normal. No stridor. He has no wheezes. He has no rales. He exhibits no tenderness.  Abdominal: Soft. Bowel sounds are normal. He exhibits no distension. There is no abdominal tenderness.  Musculoskeletal:        General: No edema. Normal range of motion.  Neurological: He is alert and oriented to person, place, and time. He has intact cranial nerves (2-12).  Skin: Skin is warm.   RADIOLOGY: CTA Chest 02/28/2019 at [facility].  Mild atherosclerotic calcifications of the thoracic aorta without aneurysmal dilatation.  The sinus of Valsalva measures up to 4.4 cm. The sinotubular junction measures 3.4 cm. The ascending aorta measures 3.6 cm. The descending thoracic aorta measures 2.9 cm.  No central pulmonary embolism.   CTA chest Aorta w/ and w/o contrast: 11/07/2021: 1. Dilated ascending thoracic aorta, measuring up to 4.9 cm at the sinuses of Valsalva. Recommend semi-annual imaging followup by CTA or MRA and referral to cardiothoracic  surgery if not already obtained. This recommendation follows 2010 ACCF/AHA/AATS/ACR/ASA/SCA/SCAI/SIR/STS/SVM Guidelines for the Diagnosis and Management of Patients With Thoracic Aortic Disease. Circulation. 2010; 121: T517-O160. Aortic aneurysm NOS (ICD10-I71.9) 2. No acute intrathoracic abnormality. Additional incidental, chronic and senescent findings as above. Small hypodense lesions within liver, largest measuring 1.5 cm, and likely consistent with small cysts versus hemangiomas.  CARDIAC DATABASE: EKG: 11/22/2021: NSR, 71 bpm, without underlying ischemia or injury pattern.  Echocardiogram: 12/30/2018: LVEF 73%, normal diastolic filling pattern, mildly dilated left atrium, mild MR, mild PR, aortic root 4.3 cm.  12/15/2020: LVEF 64%, mild LVH, normal diastolic pattern, normal LAP, mild MR. The aortic root is dilated (Sinus of Valsalva 4.59cm and Sinotubular junction 4.22cm). Proximal ascending aorta not well visualized.   Stress Testing:  Lexiscan (Walking with mod Bruce) Sestamibi Stress Test 12/01/2019: Non-diagnostic ECG stress.  due to pharmacologic stress. There is mild diaphragmatic attenuation in the inferior wall.  Myocardial perfusion is normal. Overall LV systolic function is mildly depressed without regional wall motion abnormalities. Stress LV EF: 47%.  Visually LVEF appears normal.  No previous exam available for comparison. Low risk.   Heart Catheterization: None  LABORATORY DATA: External Labs: Collected: 11/19/2019 Hemoglobin: 17.3 g/dL, hematocrit 51.2. Creatinine 0.81 mg/dL. eGFR: 99 mL/min per 1.73 m Lipid profile: Total cholesterol 111, triglycerides 83, HDL 30, LDL 64, non-HDL 81 Hemoglobin A1c: 5.7  External labs: Collected: 07/13/2021 available in Care Everywhere. Sodium 138, potassium 4.3, chloride 104, bicarb 26, BUN 7, creatinine 0.7. AST 16, ALT 18 Hemoglobin 15.7 g/dL, hematocrit 46.4%.  Collected 03/10/2021 Care Everywhere at PCP. Total  cholesterol 101, triglycerides 83, LDL 45, HDL 39, non-HDL 62  IMPRESSION:    ICD-10-CM   1. Dilatation of aortic sinus of Valsalva  Q25.49 EKG 12-Lead    MR ANGIO CHEST W WO CONTRAST    2. Coronary atherosclerosis due to calcified coronary lesion of native artery  I25.10    I25.84     3. Essential hypertension  I10     4. Family history of early CAD  Z39.49     46. Former smoker  Z87.891     84. Hypercholesteremia  E78.00     7. Class 1 obesity due to excess calories without serious comorbidity with body mass index (BMI) of 33.0 to 33.9 in adult  E66.09    Z68.33        RECOMMENDATIONS: Daniel Villanueva is a 61 y.o. male whose past medical history and cardiovascular risk factors include: Coronary artery calcification, aortic root dilatation, hypertension, hyperlipidemia, former smoker, obesity due to excess calories.  Dilatation of aortic sinus of Valsalva Recent CTA chest gated protocol July 2023 notes sinus of Valsalva dilatation of 49 mm. Patient states that his home blood pressures are well controlled on current medical therapy. Reemphasized the importance of low-salt diet. Recommended a goal systolic blood pressure of less than 120 mmHg. We discussed symptoms with regards to aortic syndromes and to seek medical attention if they are present by going to the closest ER via EMS. As recommended by current guidelines we will repeat imaging study in 6 months to reevaluate aortic dilatation and aortic regurgitation per MRA.  Coronary atherosclerosis due to calcified coronary lesion of native artery Continue aspirin and statin therapy. Ischemic work-up as outlined above. Denies angina pectoris.  Essential hypertension Home blood pressures are well controlled. Medications reconciled. Patient is asked to check his blood pressures frequently to make sure that his systolic blood pressures are consistently near 120 mmHg or lower if able to tolerate. Reemphasized importance of a  low-salt diet.  Hypercholesteremia Currently on atorvastatin.   He denies myalgia or other side effects. Most recent lipids dated November 2022, independently reviewed as noted above.  LDL at goal Currently managed by primary care provider.  Class 1 obesity due to excess calories without serious comorbidity with body mass index (BMI) of 33.0 to 33.9 in adult Body mass index is 33.1 kg/m. I reviewed with the patient the importance of diet, regular physical activity/exercise, weight loss.   Patient is educated on increasing physical activity gradually as tolerated.  With the goal of moderate intensity exercise for 30 minutes a day 5 days a week.  FINAL MEDICATION LIST END OF ENCOUNTER: No orders of the defined types were placed in this encounter.    Current Outpatient Medications:  amLODipine (NORVASC) 5 MG tablet, TAKE 1 TABLET(5 MG) BY MOUTH EVERY MORNING, Disp: 90 tablet, Rfl: 0   aspirin EC 81 MG tablet, Take 1 tablet (81 mg total) by mouth daily. Swallow whole., Disp: 90 tablet, Rfl: 3   atorvastatin (LIPITOR) 40 MG tablet, TAKE 1 TABLET(40 MG) BY MOUTH AT BEDTIME, Disp: 30 tablet, Rfl: 0   carvedilol (COREG) 3.125 MG tablet, TAKE 1 TABLET(3.125 MG) BY MOUTH TWICE DAILY WITH A MEAL, Disp: 180 tablet, Rfl: 1   divalproex (DEPAKOTE) 250 MG DR tablet, Take 1 tablet (250 mg total) by mouth every 12 (twelve) hours. (Patient taking differently: Take 250 mg by mouth 2 (two) times daily.), Disp: 60 tablet, Rfl: 0   HYDROcodone-acetaminophen (NORCO) 10-325 MG tablet, Take 1 tablet by mouth every 6 (six) hours as needed., Disp: , Rfl:    losartan (COZAAR) 100 MG tablet, TAKE 1 TABLET(100 MG) BY MOUTH EVERY EVENING, Disp: 90 tablet, Rfl: 0   omeprazole (PRILOSEC) 20 MG capsule, Take 20 mg by mouth every morning., Disp: , Rfl:    psyllium (METAMUCIL) 58.6 % powder, Take 1 packet by mouth every evening., Disp: , Rfl:    spironolactone (ALDACTONE) 25 MG tablet, TAKE 1 TABLET(25 MG) BY MOUTH EVERY  MORNING, Disp: 90 tablet, Rfl: 3   traMADol (ULTRAM) 50 MG tablet, Take 25-50 mg by mouth every 4 (four) hours as needed for moderate pain. , Disp: , Rfl:    zolpidem (AMBIEN) 5 MG tablet, Take 5-10 mg by mouth at bedtime as needed for sleep., Disp: , Rfl: 5   fluticasone (FLONASE) 50 MCG/ACT nasal spray, Place 1 spray into both nostrils daily., Disp: , Rfl:   Orders Placed This Encounter  Procedures   MR ANGIO CHEST W WO CONTRAST   EKG 12-Lead   --Continue cardiac medications as reconciled in final medication list. --Return in about 28 weeks (around 06/06/2022) for Follow up Dilated ascending thoracic aorta,. Or sooner if needed. --Continue follow-up with your primary care physician regarding the management of your other chronic comorbid conditions.  Patient's questions and concerns were addressed to his satisfaction. He voices understanding of the instructions provided during this encounter.   This note was created using a voice recognition software as a result there may be grammatical errors inadvertently enclosed that do not reflect the nature of this encounter. Every attempt is made to correct such errors.  Rex Kras, [place], [facility]  Pager: [phone] Office: [phone]

## 2021-12-01 ENCOUNTER — Other Ambulatory Visit: Payer: Self-pay | Admitting: Cardiology

## 2021-12-01 DIAGNOSIS — E7800 Pure hypercholesterolemia, unspecified: Secondary | ICD-10-CM

## 2021-12-25 ENCOUNTER — Other Ambulatory Visit: Payer: Self-pay | Admitting: Cardiology

## 2021-12-25 DIAGNOSIS — E7800 Pure hypercholesterolemia, unspecified: Secondary | ICD-10-CM

## 2022-01-23 ENCOUNTER — Other Ambulatory Visit: Payer: Self-pay | Admitting: Cardiology

## 2022-01-23 DIAGNOSIS — E7800 Pure hypercholesterolemia, unspecified: Secondary | ICD-10-CM

## 2022-01-23 DIAGNOSIS — I10 Essential (primary) hypertension: Secondary | ICD-10-CM

## 2022-02-22 ENCOUNTER — Other Ambulatory Visit: Payer: Self-pay | Admitting: Cardiology

## 2022-02-22 DIAGNOSIS — I10 Essential (primary) hypertension: Secondary | ICD-10-CM

## 2022-02-22 DIAGNOSIS — Q2549 Other congenital malformations of aorta: Secondary | ICD-10-CM

## 2022-02-22 DIAGNOSIS — E7800 Pure hypercholesterolemia, unspecified: Secondary | ICD-10-CM

## 2022-03-25 ENCOUNTER — Other Ambulatory Visit: Payer: Self-pay | Admitting: Cardiology

## 2022-03-25 DIAGNOSIS — E7800 Pure hypercholesterolemia, unspecified: Secondary | ICD-10-CM

## 2022-03-25 DIAGNOSIS — I10 Essential (primary) hypertension: Secondary | ICD-10-CM

## 2022-06-16 ENCOUNTER — Ambulatory Visit: Payer: Managed Care, Other (non HMO) | Admitting: Cardiology

## 2022-06-16 ENCOUNTER — Encounter: Payer: Self-pay | Admitting: Cardiology

## 2022-06-16 VITALS — BP 118/80 | HR 70 | Resp 18 | Ht 74.0 in | Wt 256.4 lb

## 2022-06-16 DIAGNOSIS — I10 Essential (primary) hypertension: Secondary | ICD-10-CM

## 2022-06-16 DIAGNOSIS — Q2549 Other congenital malformations of aorta: Secondary | ICD-10-CM

## 2022-06-16 DIAGNOSIS — Z8249 Family history of ischemic heart disease and other diseases of the circulatory system: Secondary | ICD-10-CM

## 2022-06-16 DIAGNOSIS — I2510 Atherosclerotic heart disease of native coronary artery without angina pectoris: Secondary | ICD-10-CM

## 2022-06-16 DIAGNOSIS — E7800 Pure hypercholesterolemia, unspecified: Secondary | ICD-10-CM

## 2022-06-16 DIAGNOSIS — Z87891 Personal history of nicotine dependence: Secondary | ICD-10-CM

## 2022-06-16 DIAGNOSIS — E6609 Other obesity due to excess calories: Secondary | ICD-10-CM

## 2022-06-16 MED ORDER — LOSARTAN POTASSIUM 100 MG PO TABS: ORAL_TABLET | ORAL | 0 refills | Status: DC

## 2022-06-16 MED ORDER — AMLODIPINE BESYLATE 5 MG PO TABS
ORAL_TABLET | ORAL | 0 refills | Status: DC
Start: 2022-06-16 — End: 2022-09-19

## 2022-06-16 NOTE — Progress Notes (Signed)
Daniel Villanueva Date of Birth: 1961/04/01 MRN: [id number] Primary Care Provider:M[place]aps, Joelene Millin, NP Former Cardiology Providers: Jeri Lager, APRN, FNP-C Primary Cardiologist: Rex Kras, DO, [facility] (established care 11/12/2019)  Date: 06/16/22 Last Office Visit: 11/22/2021  Chief Complaint  Patient presents with   dilated ascending aorta   Follow-up    28 weeks    HPI  Daniel Villanueva is a 62 y.o.  male whose past medical history and cardiovascular risk factors include: Coronary artery calcification, aortic root dilatation, hypertension, hyperlipidemia, former smoker, obesity due to excess calories.  Patient presents today for 24-monthfollow-up visit given his history of dilated aortic root.  His home blood pressures are well-controlled.  He denies any symptoms of pain between his shoulder blades, no new onset of syncope/near syncope, vision changes, or neurological deficits.  Last imaging study in July 2023 noted dilated ascending aorta at the sinuses of Valsalva to be 49 mm.  Semiannual imaging recommended and he was scheduled to have an MRA of the aorta in January 2024 which appears to be pending.  Since last office visit he has anginal discomfort or heart failure symptoms.  No near-syncope or syncopal events.  No hospitalizations or urgent care visits since last office encounter for cardiovascular reasons.  FUNCTIONAL STATUS: No structured exercise program or daily routine.  ALLERGIES: Allergies  Allergen Reactions   Adhesive [Tape] Dermatitis    Skin irritation      MEDICATION LIST PRIOR TO VISIT: Current Outpatient Medications on File Prior to Visit  Medication Sig Dispense Refill   aspirin EC 81 MG tablet Take 1 tablet (81 mg total) by mouth daily. Swallow whole. 90 tablet 3   atorvastatin (LIPITOR) 40 MG tablet TAKE 1 TABLET(40 MG) BY MOUTH AT BEDTIME 90 tablet 1   carvedilol (COREG) 3.125 MG tablet TAKE 1 TABLET(3.125 MG) BY MOUTH TWICE DAILY WITH A MEAL 180 tablet 1    divalproex (DEPAKOTE) 250 MG DR tablet Take 1 tablet (250 mg total) by mouth every 12 (twelve) hours. (Patient taking differently: Take 250 mg by mouth 2 (two) times daily.) 60 tablet 0   fluticasone (FLONASE) 50 MCG/ACT nasal spray Place 1 spray into both nostrils daily.     HYDROcodone-acetaminophen (NORCO) 10-325 MG tablet Take 1 tablet by mouth every 6 (six) hours as needed.     omeprazole (PRILOSEC) 20 MG capsule Take 20 mg by mouth every morning.     psyllium (METAMUCIL) 58.6 % powder Take 1 packet by mouth every evening.     spironolactone (ALDACTONE) 25 MG tablet TAKE 1 TABLET(25 MG) BY MOUTH EVERY MORNING 90 tablet 3   No current facility-administered medications on file prior to visit.    PAST MEDICAL HISTORY: Past Medical History:  Diagnosis Date   Arthritis    back, neck &hands    Coronary artery calcification    GERD (gastroesophageal reflux disease)    Headache    controlled on Depakote   High cholesterol    History of hiatal hernia    Hypertension    Spondylolisthesis of lumbar region     PAST SURGICAL HISTORY: Past Surgical History:  Procedure Laterality Date   BACK SURGERY     EYE SURGERY     bot eyes- cataracts removed- /w IOL   HEMORROIDECTOMY  1988   HERNIA REPAIR  [id number]  umbilical hernia    NECK SURGERY     TONSILLECTOMY      FAMILY HISTORY: The patient's family history includes Heart disease in his  father and mother.   SOCIAL HISTORY:  The patient  reports that he quit smoking about 34 years ago. His smoking use included cigarettes. He has never used smokeless tobacco. He reports that he does not currently use alcohol. He reports that he does not use drugs.  Review of Systems  Cardiovascular:  Negative for chest pain, claudication, dyspnea on exertion, leg swelling, near-syncope, orthopnea, palpitations, paroxysmal nocturnal dyspnea and syncope.  Respiratory:  Negative for shortness of breath.     PHYSICAL EXAM:    06/16/2022   11:37 AM  11/22/2021    3:11 PM 08/02/2021   11:50 AM  Vitals with BMI  Height 6' 2"$  6' 2"$  6' 2"$   Weight 256 lbs 6 oz 257 lbs 13 oz 253 lbs 3 oz  BMI 32.91 [id number]  Systolic 123456 [id number]  Diastolic 80 82 88  Pulse 70 72 55    Physical Exam  Neck: No JVD present.  Cardiovascular: Normal rate, regular rhythm, S1 normal, S2 normal, intact distal pulses and normal pulses. Exam reveals no gallop, no S3 and no S4.  No murmur heard. Pulmonary/Chest: Effort normal and breath sounds normal. No stridor. He has no wheezes. He has no rales. He exhibits no tenderness.  Abdominal: Soft. Bowel sounds are normal. He exhibits no distension. There is no abdominal tenderness.  Musculoskeletal:        General: No edema. Normal range of motion.  Neurological: He is alert and oriented to person, place, and time. He has intact cranial nerves (2-12).  Skin: Skin is warm.   RADIOLOGY: [facility] Chest 02/28/2019 at [facility].  Mild atherosclerotic calcifications of the thoracic aorta without aneurysmal dilatation.  The sinus of Valsalva measures up to 4.4 cm. The sinotubular junction measures 3.4 cm. The ascending aorta measures 3.6 cm. The descending thoracic aorta measures 2.9 cm.  No central pulmonary embolism.   CTA chest Aorta w/ and w/o contrast: 11/07/2021: 1. Dilated ascending thoracic aorta, measuring up to 4.9 cm at the sinuses of Valsalva. Recommend semi-annual imaging followup by CTA or MRA and referral to cardiothoracic surgery if not already obtained. This recommendation follows 2010 ACCF/AHA/AATS/ACR/ASA/SCA/SCAI/SIR/STS/SVM Guidelines for the Diagnosis and Management of Patients With Thoracic Aortic Disease. Circulation. 2010; [id number]. Aortic aneurysm NOS (ICD10-I71.9) 2. No acute intrathoracic abnormality. Additional incidental, chronic and senescent findings as above. Small hypodense lesions within liver, largest measuring 1.5 cm, and likely consistent with small cysts versus  hemangiomas.  CARDIAC DATABASE: EKG: 06/16/2022: Sinus rhythm, 77 bpm, without underlying ischemia or injury pattern.  Echocardiogram: 12/30/2018: LVEF [id number], normal diastolic filling pattern, mildly dilated left atrium, mild MR, mild PR, aortic root 4.3 cm.  12/15/2020: LVEF 64%, mild LVH, normal diastolic pattern, normal LAP, mild MR. The aortic root is dilated (Sinus of Valsalva 4.59cm and Sinotubular junction 4.22cm). Proximal ascending aorta not well visualized.   Stress Testing:  Lexiscan (Walking with mod Bruce) Sestamibi Stress Test 12/01/2019: Non-diagnostic ECG stress. due to pharmacologic stress. There is mild diaphragmatic attenuation in the inferior wall.  Myocardial perfusion is normal. Overall LV systolic function is mildly depressed without regional wall motion abnormalities. Stress LV EF: 47%.  Visually LVEF appears normal.  No previous exam available for comparison. Low risk.   Heart Catheterization: None  LABORATORY DATA: External Labs: Collected: 11/19/2019 Hemoglobin: 17.3 g/dL, hematocrit 51.2. Creatinine 0.81 mg/dL. eGFR: 99 mL/min per 1.73 m Lipid profile: Total cholesterol 111, triglycerides 83, HDL 30, LDL 64, non-HDL 81 Hemoglobin A1c: 5.7  External labs:  Collected: 07/13/2021 available in Care Everywhere. Sodium 138, potassium 4.3, chloride 104, bicarb 26, BUN 7, creatinine 0.7. AST 16, ALT 18 Hemoglobin 15.7 g/dL, hematocrit 46.4%.  Collected 03/10/2021 Care Everywhere at PCP. Total cholesterol 101, triglycerides 83, LDL 45, HDL 39, non-HDL 62  IMPRESSION:    ICD-10-CM   1. Dilatation of aortic sinus of Valsalva  Q25.49 EKG 12-Lead    Ambulatory referral to Cardiothoracic Surgery    amLODipine (NORVASC) 5 MG tablet    2. Coronary atherosclerosis due to calcified coronary lesion of native artery  I25.10 EKG 12-Lead   I25.84     3. Essential hypertension  I10 losartan (COZAAR) 100 MG tablet    amLODipine (NORVASC) 5 MG tablet    4. Family  history of early CAD  Z81.49     84. Former smoker  Z87.891     13. Hypercholesteremia  E78.00     7. Class 1 obesity due to excess calories without serious comorbidity with body mass index (BMI) of 32.0 to 32.9 in adult  E66.09    Z68.32        RECOMMENDATIONS: Daniel Villanueva is a 62 y.o. male whose past medical history and cardiovascular risk factors include: Coronary artery calcification, aortic root dilatation, hypertension, hyperlipidemia, former smoker, obesity due to excess calories.  Dilatation of aortic sinus of Valsalva Last imaging in July 2023 noted sinus of Valsalva dilatation at 49 mm. He was supposed to have a repeat study in January 2024 but this is still pending. Home blood pressures are well-controlled on current antihypertensive medications. Will refer him to cardiothoracic surgery for longitudinal follow-up.   Consult placed and spoke to coordinator Levonne Spiller as well after his visit today.   Coronary atherosclerosis due to calcified coronary lesion of native artery Continue aspirin and statin therapy. Prior ischemic workup reviewed. No additional testing is warranted at this time. EKG nonischemic. We emphasized the importance of secondary prevention.  Essential hypertension Office blood pressure well-controlled. Refill losartan and amlodipine. We emphasized importance of low-salt diet and blood pressure around 120 mmHg or lower if able to tolerate.  Hypercholesteremia Currently on atorvastatin.   He denies myalgia or other side effects. Recently had labs with PCP earlier this morning and he will forward [place] a copy for reference when available.  Currently managed by primary care provider.  Once he is established with cardiothoracic surgery given his dilated ascending aorta/sinus of Valsalva recommend longitudinal follow-up with our practice on annual basis sooner if needed.  For now I will see him back in 6 months.  FINAL MEDICATION LIST END OF ENCOUNTER: Meds  ordered this encounter  Medications   losartan (COZAAR) 100 MG tablet    Sig: TAKE 1 TABLET(100 MG) BY MOUTH EVERY EVENING    Dispense:  90 tablet    Refill:  0   amLODipine (NORVASC) 5 MG tablet    Sig: TAKE 1 TABLET(5 MG) BY MOUTH EVERY MORNING    Dispense:  90 tablet    Refill:  0     Current Outpatient Medications:    aspirin EC 81 MG tablet, Take 1 tablet (81 mg total) by mouth daily. Swallow whole., Disp: 90 tablet, Rfl: 3   atorvastatin (LIPITOR) 40 MG tablet, TAKE 1 TABLET(40 MG) BY MOUTH AT BEDTIME, Disp: 90 tablet, Rfl: 1   carvedilol (COREG) 3.125 MG tablet, TAKE 1 TABLET(3.125 MG) BY MOUTH TWICE DAILY WITH A MEAL, Disp: 180 tablet, Rfl: 1   divalproex (DEPAKOTE) 250 MG DR tablet, Take  1 tablet (250 mg total) by mouth every 12 (twelve) hours. (Patient taking differently: Take 250 mg by mouth 2 (two) times daily.), Disp: 60 tablet, Rfl: 0   fluticasone (FLONASE) 50 MCG/ACT nasal spray, Place 1 spray into both nostrils daily., Disp: , Rfl:    HYDROcodone-acetaminophen (NORCO) 10-325 MG tablet, Take 1 tablet by mouth every 6 (six) hours as needed., Disp: , Rfl:    omeprazole (PRILOSEC) 20 MG capsule, Take 20 mg by mouth every morning., Disp: , Rfl:    psyllium (METAMUCIL) 58.6 % powder, Take 1 packet by mouth every evening., Disp: , Rfl:    spironolactone (ALDACTONE) 25 MG tablet, TAKE 1 TABLET(25 MG) BY MOUTH EVERY MORNING, Disp: 90 tablet, Rfl: 3   amLODipine (NORVASC) 5 MG tablet, TAKE 1 TABLET(5 MG) BY MOUTH EVERY MORNING, Disp: 90 tablet, Rfl: 0   losartan (COZAAR) 100 MG tablet, TAKE 1 TABLET(100 MG) BY MOUTH EVERY EVENING, Disp: 90 tablet, Rfl: 0  Orders Placed This Encounter  Procedures   Ambulatory referral to Cardiothoracic Surgery   EKG 12-Lead   --Continue cardiac medications as reconciled in final medication list. --Return in about 6 months (around 12/15/2022) for Follow up dilated aortic root. Or sooner if needed. --Continue follow-up with your primary care  physician regarding the management of your other chronic comorbid conditions.  Patient's questions and concerns were addressed to his satisfaction. He voices understanding of the instructions provided during this encounter.   This note was created using a voice recognition software as a result there may be grammatical errors inadvertently enclosed that do not reflect the nature of this encounter. Every attempt is made to correct such errors.  Rex Kras, [place], [facility]  Pager: [phone] Office: [phone]

## 2022-07-24 ENCOUNTER — Ambulatory Visit
Admission: RE | Admit: 2022-07-24 | Discharge: 2022-07-24 | Disposition: A | Discharge status: Home / Self Care | Payer: Managed Care, Other (non HMO) | Source: Ambulatory Visit | Attending: Cardiology | Admitting: Cardiology

## 2022-07-24 DIAGNOSIS — Q2549 Other congenital malformations of aorta: Secondary | ICD-10-CM

## 2022-07-24 MED ORDER — GADOPICLENOL 0.5 MMOL/ML IV SOLN
10.0000 mL | Freq: Once | INTRAVENOUS | Status: AC | PRN
  Administered 2022-07-24: 10 mL via INTRAVENOUS

## 2022-07-28 ENCOUNTER — Encounter: Payer: Managed Care, Other (non HMO) | Admitting: Thoracic Surgery (Cardiothoracic Vascular Surgery)

## 2022-07-30 NOTE — Progress Notes (Unsigned)
[name]       [place] [address]             [phone]           Daniel Villanueva [facility] [id number] Date of Birth: May 24, 1960  Daniel Kras, DO Everardo Beals, NP  Chief Complaint: Aortic root dilation    History of Present Illness:     Pt is a 62 yo male who has been noted to have an aortic root dilation for the past several years. Pt had echo with sinuses 4.6cm with ascending aorta around 4cm and last years ct had the sinuses measured at 4.9cm. An MRI done this past month had the ascending aorta again at 4.0 cm and was unable to get accurate measurements of the aortic root. Pt has no cp and family history of his mother passing from a "leaky valve". Pt is treated for HTN. Pt has no AI on last echo     Past Medical History:  Diagnosis Date   Arthritis    back, neck &hands    Coronary artery calcification    GERD (gastroesophageal reflux disease)    Headache    controlled on Depakote   High cholesterol    History of hiatal hernia    Hypertension    Spondylolisthesis of lumbar region     Past Surgical History:  Procedure Laterality Date   BACK SURGERY     EYE SURGERY     bot eyes- cataracts removed- /w IOL   HEMORROIDECTOMY  1988   HERNIA REPAIR  [id number]   umbilical hernia    NECK SURGERY     TONSILLECTOMY      Social History   Tobacco Use  Smoking Status Former   Years: 10   Types: Cigarettes   Quit date: 05/02/1988   Years since quitting: 34.2  Smokeless Tobacco Never    Social History   Substance and Sexual Activity  Alcohol Use Not Currently    Social History   Socioeconomic History   Marital status: Married    Spouse name: Daniel Villanueva   Number of children: 3   Years of education: Not on file   Highest education level: Not on file  Occupational History   Not on file  Tobacco Use   Smoking status: Former    Years: 10    Types: Cigarettes    Quit date: 05/02/1988    Years since quitting: 34.2    Smokeless tobacco: Never  Vaping Use   Vaping Use: Never used  Substance and Sexual Activity   Alcohol use: Not Currently   Drug use: No   Sexual activity: Not on file  Other Topics Concern   Not on file  Social History Narrative   Not on file   Social Determinants of Health   Financial Resource Strain: Not on file  Food Insecurity: Not on file  Transportation Needs: Not on file  Physical Activity: Not on file  Stress: Not on file  Social Connections: Not on file  Intimate Partner Violence: Not on file    Allergies  Allergen Reactions   Adhesive [Tape] Dermatitis    Skin irritation     Current Outpatient Medications  Medication Sig Dispense Refill   amLODipine (NORVASC) 5 MG tablet TAKE 1 TABLET(5 MG) BY MOUTH EVERY MORNING 90 tablet 0   aspirin EC 81 MG tablet Take 1 tablet (81 mg total) by mouth daily. Swallow whole. 90 tablet 3  atorvastatin (LIPITOR) 40 MG tablet TAKE 1 TABLET(40 MG) BY MOUTH AT BEDTIME 90 tablet 1   carvedilol (COREG) 3.125 MG tablet TAKE 1 TABLET(3.125 MG) BY MOUTH TWICE DAILY WITH A MEAL 180 tablet 1   divalproex (DEPAKOTE) 250 MG DR tablet Take 1 tablet (250 mg total) by mouth every 12 (twelve) hours. (Patient taking differently: Take 250 mg by mouth 2 (two) times daily.) 60 tablet 0   fluticasone (FLONASE) 50 MCG/ACT nasal spray Place 1 spray into both nostrils daily.     HYDROcodone-acetaminophen (NORCO) 10-325 MG tablet Take 1 tablet by mouth every 6 (six) hours as needed.     losartan (COZAAR) 100 MG tablet TAKE 1 TABLET(100 MG) BY MOUTH EVERY EVENING 90 tablet 0   omeprazole (PRILOSEC) 20 MG capsule Take 20 mg by mouth every morning.     psyllium (METAMUCIL) 58.6 % powder Take 1 packet by mouth every evening.     spironolactone (ALDACTONE) 25 MG tablet TAKE 1 TABLET(25 MG) BY MOUTH EVERY MORNING 90 tablet 3   No current facility-administered medications for this visit.     Family History  Problem Relation Age of Onset   Heart disease  Mother    Heart disease Father        Physical Exam: Lungs: clear Cardiac: RR no murmur      Diagnostic Studies & Laboratory data: I have personally reviewed the following studies and agree with the findings     Recent Radiology Findings:   MRA (07/2022) FINDINGS: VASCULAR   Aorta: Intact thoracic aorta without acute aortic process, dissection, or enlarging aneurysm. Patent 3 vessel arch anatomy. Stable minimal fusiform dilatation of the ascending thoracic aorta measuring 40 mm, previously 39 mm. Remainder of the aorta is normal in caliber as before. Obtaining measurements of the aortic root and sinotubular junction would be inaccurate, because of cardiac motion. Overall stable appearance.   Heart: Normal heart size.  No pericardial effusion.   Pulmonary Arteries: Central pulmonary arteries are normal in caliber and appear grossly patent.   Other: Central venous structures are patent. No veno-occlusive finding. No anomalous pulmonary venous return.   NON-VASCULAR   No bulky adenopathy appreciated. No focal airspace opacity, collapse or consolidation. No pleural effusion. Symmetric gynecomastia noted as before.   Included abdomen demonstrates a stable small left hepatic cyst measuring 7 mm.   Right upper thoracic back subcutaneous cyst measures 12 mm compatible with a sebaceous cyst, also unchanged.   IMPRESSION: Stable appearance of the thoracic aorta with minimal fusiform dilatation of the ascending thoracic aorta measuring 40 mm, previously 39 mm.    Recent Lab Findings: Lab Results  Component Value Date   WBC 8.2 05/14/2017   HGB 14.4 05/14/2017   HCT 40.8 05/14/2017   PLT 196 05/14/2017   GLUCOSE 172 (H) 05/15/2017   ALT 24 05/02/2017   AST 25 05/02/2017   NA 129 (L) 05/15/2017   K 5.2 (H) 05/15/2017   CL 95 (L) 05/15/2017   CREATININE 0.75 05/15/2017   BUN 12 05/15/2017   CO2 26 05/15/2017      Assessment / Plan:     Aortic root  dilation  PT with dilation of aortic root without AI. Pt without significant dilation of the ascending aorta and MRI not with accurate measurements of root. At this point we discussed the natural history of aortic aneurysms and recommendations for elective surgery when they become over 5 - 5.5cm. Pt with the possible need for root replacement and then valve  replacement (if aortic valve sparing root replacement not able to be performed) would best be served with ongoing survielance with echo/CT of chest 6-12 months schedule. Pt was advised to control is BP and to not lift excessive weights.    I have spent 60 min in review of the records, viewing studies and in face to face with patient and in coordination of future care    Lucius Bier 07/30/2022 8:44 AM

## 2022-07-31 ENCOUNTER — Institutional Professional Consult (permissible substitution): Payer: Managed Care, Other (non HMO) | Admitting: Thoracic Surgery (Cardiothoracic Vascular Surgery)

## 2022-07-31 ENCOUNTER — Encounter: Payer: Self-pay | Admitting: Thoracic Surgery (Cardiothoracic Vascular Surgery)

## 2022-07-31 VITALS — BP 156/97 | HR 63 | Resp 20 | Ht 74.0 in | Wt 253.0 lb

## 2022-07-31 DIAGNOSIS — I7121 Aneurysm of the ascending aorta, without rupture: Secondary | ICD-10-CM

## 2022-07-31 NOTE — Patient Instructions (Signed)
Follow up with cardiology and routine echo/ct every 6-12 months to monitor size of aorta

## 2022-08-07 IMAGING — CT CT L SPINE W/O CM
1 of 7 series · 5 of 14 positions shown, 7 images · non-contrast
Comparison: Lumbar MRI 09/24/2018

CLINICAL DATA: Lumbar radiculopathy.  History of back surgery

EXAM:
CT LUMBAR SPINE WITHOUT CONTRAST
TECHNIQUE: Multidetector CT imaging of the lumbar spine was performed without
intravenous contrast administration. Multiplanar CT image
reconstructions were also generated.

[Series 3: l spine soft (person_name) · axial · 0.36mm/px · z∈[-268,-112]mm · 5 of 78 slices shown, 7 images]
[im 13/78  soft-tissue]
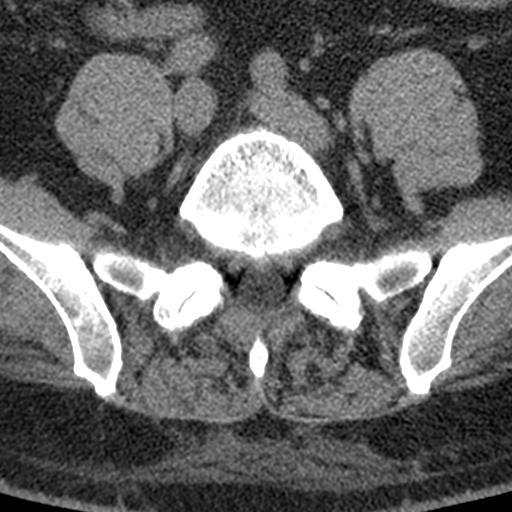
[im 13/78  bone]
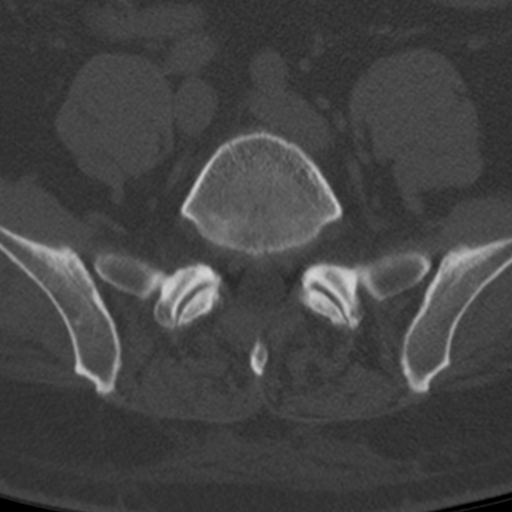
[im 26/78  bone]
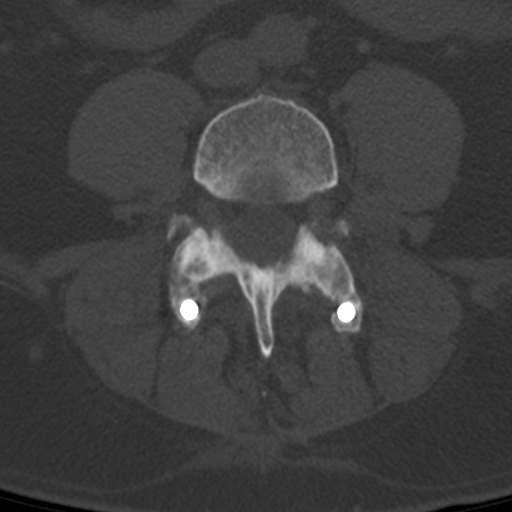
[im 39/78  bone]
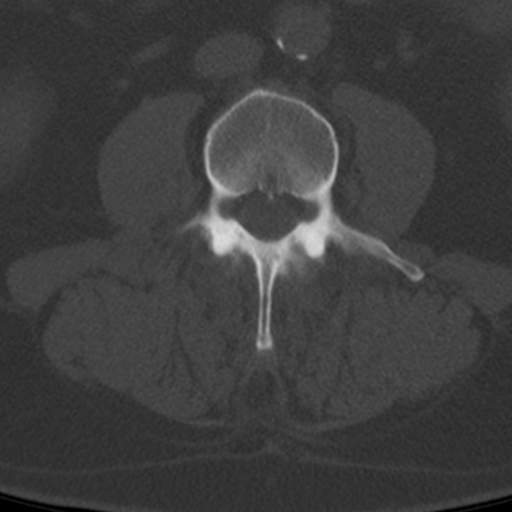
[im 52/78  bone]
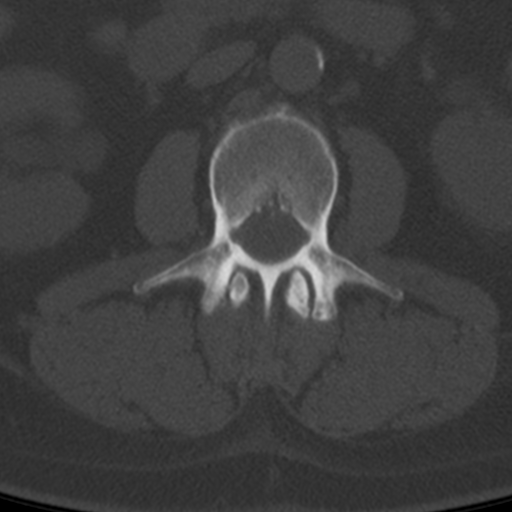
[im 65/78  soft-tissue]
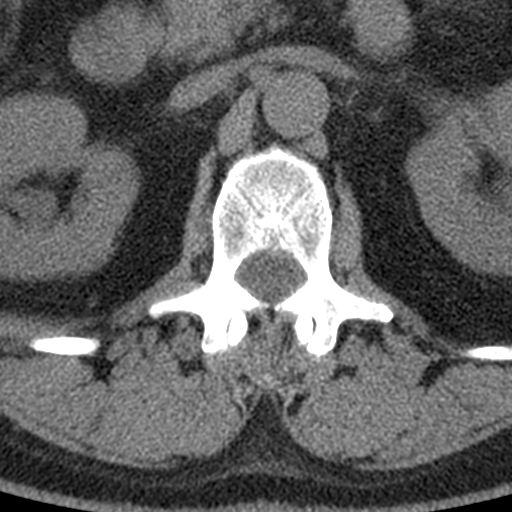
[im 65/78  bone]
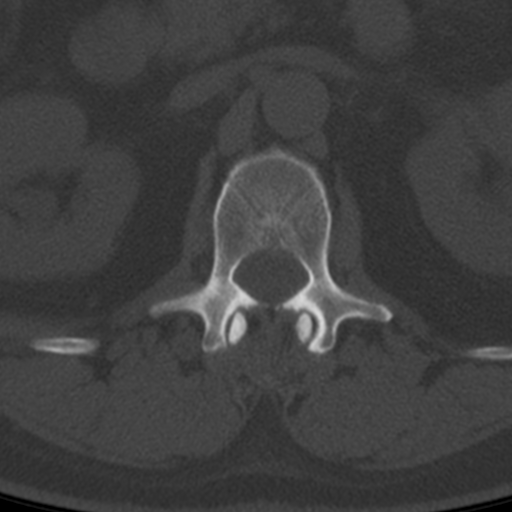

[5 of 14 positions shown; findings below may reference images not displayed]

FINDINGS: Segmentation: Normal

Alignment: Mild retrolisthesis L1-2 and L2-3 and L3-4

Vertebrae: Negative for fracture or mass

Hardware: Pedicle screw and interbody fusion L4-5. Hardware in
satisfactory position.

Paraspinal and other soft tissues: Negative for paraspinous mass or
adenopathy. Mild atherosclerotic aorta.

Disc levels: L1-2: Mild disc and mild facet degeneration. Negative
for stenosis

L2-3: Mild disc bulging. Moderate facet degeneration. No significant
stenosis

L3-4: Diffuse bulging of the disc and moderate facet degeneration.
Mild spinal stenosis. Mild subarticular stenosis bilaterally

L4-5: PLIF with posterior decompression. Negative for stenosis.
Solid interbody fusion.

L5-S1: Disc degeneration with disc space narrowing and disc bulging.
Mild facet degeneration. Negative for stenosis.
IMPRESSION: PLIF L4-5.  Solid fusion without stenosis

Mild degenerative change throughout the lumbar spine. Mild spinal
stenosis and mild subarticular stenosis bilaterally at L3-4.

## 2022-08-09 ENCOUNTER — Other Ambulatory Visit: Payer: Self-pay | Admitting: Cardiology

## 2022-08-09 DIAGNOSIS — Q2549 Other congenital malformations of aorta: Secondary | ICD-10-CM

## 2022-08-09 NOTE — Progress Notes (Signed)
LMTCB

## 2022-08-09 NOTE — Progress Notes (Signed)
Gave patient this information, he acknowledged information and had no further questions.

## 2022-08-27 ENCOUNTER — Other Ambulatory Visit: Payer: Self-pay | Admitting: Cardiology

## 2022-08-27 DIAGNOSIS — I10 Essential (primary) hypertension: Secondary | ICD-10-CM

## 2022-09-19 ENCOUNTER — Other Ambulatory Visit: Payer: Self-pay | Admitting: Cardiology

## 2022-09-19 DIAGNOSIS — I10 Essential (primary) hypertension: Secondary | ICD-10-CM

## 2022-09-19 DIAGNOSIS — Q2549 Other congenital malformations of aorta: Secondary | ICD-10-CM

## 2022-09-19 DIAGNOSIS — E7800 Pure hypercholesterolemia, unspecified: Secondary | ICD-10-CM

## 2022-12-15 ENCOUNTER — Ambulatory Visit: Payer: Managed Care, Other (non HMO) | Admitting: Cardiology

## 2022-12-18 ENCOUNTER — Other Ambulatory Visit: Payer: Self-pay | Admitting: Cardiology

## 2022-12-18 DIAGNOSIS — Q2549 Other congenital malformations of aorta: Secondary | ICD-10-CM

## 2022-12-18 DIAGNOSIS — I10 Essential (primary) hypertension: Secondary | ICD-10-CM

## 2023-01-12 ENCOUNTER — Encounter: Payer: Self-pay | Admitting: Cardiology

## 2023-01-12 ENCOUNTER — Ambulatory Visit: Payer: Managed Care, Other (non HMO) | Admitting: Cardiology

## 2023-01-12 VITALS — BP 130/80 | HR 87 | Resp 16 | Ht 74.0 in | Wt 252.0 lb

## 2023-01-12 DIAGNOSIS — I77810 Thoracic aortic ectasia: Secondary | ICD-10-CM

## 2023-01-12 DIAGNOSIS — Q2549 Other congenital malformations of aorta: Secondary | ICD-10-CM

## 2023-01-12 DIAGNOSIS — I10 Essential (primary) hypertension: Secondary | ICD-10-CM

## 2023-01-12 DIAGNOSIS — Z87891 Personal history of nicotine dependence: Secondary | ICD-10-CM

## 2023-01-12 DIAGNOSIS — E7800 Pure hypercholesterolemia, unspecified: Secondary | ICD-10-CM

## 2023-01-12 DIAGNOSIS — I2510 Atherosclerotic heart disease of native coronary artery without angina pectoris: Secondary | ICD-10-CM

## 2023-01-12 MED ORDER — CARVEDILOL 6.25 MG PO TABS: 6.2500 mg | ORAL_TABLET | Freq: Two times a day (BID) | ORAL | 0 refills | Status: DC

## 2023-01-12 NOTE — Progress Notes (Signed)
Daniel Villanueva Date of Birth: 18-Dec-1960 MRN: [id number] Primary Care Provider:Morton, Joelene Millin, NP Former Cardiology Providers: Altamese Buckingham, APRN, FNP-C Primary Cardiologist: Tessa Lerner, DO, [facility] (established care 11/12/2019)  Date: 01/12/23 Last Office Visit: June 16, 2022  Chief Complaint  Patient presents with   Dilatation of aortic sinus of Valsalva    HPI  Daniel Villanueva is a 62 y.o.  male whose past medical history and cardiovascular risk factors include: Coronary artery calcification, aortic root dilatation, hypertension, hyperlipidemia, former smoker, obesity due to excess calories.  Patient has a history of a dilated aortic root and proximal ascending aorta.  In 2020 patient had a CTA at [facility] which noted sinuses of Valsalva at 44 mm and proximal ascending aorta measured 36 mm.  Echocardiogram in August 2022 noted sinuses of Valsalva to be 46 mm and proximal ascending aorta was not well visualized.  In July 2023 he had a CTA chest aorta protocol and the sinuses of Valsalva measured at 49 mm.  He was referred to cardiothoracic surgery in March 2024.  Prior to that visit he had an MRI of the chest which noted proximal ascending aorta to be 40 mm of aortic root and sinotubular junction was not commented on due to cardiac motion.  Annual follow-up studies are recommended.  Over the last 1 year patient denies anginal chest pain or heart failure symptoms.  Patient states that his home blood pressures are around 130 mmHg but recently has not been checking it often.  His wife is concerned that sometimes he does lift heavy objects despite knowing that he needs to refrain from such activities.  He denies any syncopal events or pain between the shoulder blades.  FUNCTIONAL STATUS: No structured exercise program or daily routine.  ALLERGIES: Allergies  Allergen Reactions   Adhesive [Tape] Dermatitis    Skin irritation      MEDICATION LIST PRIOR TO  VISIT: Current Outpatient Medications on File Prior to Visit  Medication Sig Dispense Refill   amLODipine (NORVASC) 5 MG tablet TAKE 1 TABLET(5 MG) BY MOUTH EVERY MORNING 90 tablet 0   aspirin EC 81 MG tablet Take 1 tablet (81 mg total) by mouth daily. Swallow whole. 90 tablet 3   atorvastatin (LIPITOR) 40 MG tablet TAKE 1 TABLET(40 MG) BY MOUTH AT BEDTIME 90 tablet 1   divalproex (DEPAKOTE) 250 MG DR tablet Take 1 tablet (250 mg total) by mouth every 12 (twelve) hours. (Patient taking differently: Take 250 mg by mouth 2 (two) times daily.) 60 tablet 0   [START ON 01/22/2023] HYDROcodone-acetaminophen (NORCO) 7.5-325 MG tablet Take by mouth.     losartan (COZAAR) 100 MG tablet TAKE 1 TABLET(100 MG) BY MOUTH EVERY EVENING 90 tablet 0   omeprazole (PRILOSEC) 20 MG capsule Take 20 mg by mouth every morning.     psyllium (METAMUCIL) 58.6 % powder Take 1 packet by mouth every evening.     spironolactone (ALDACTONE) 25 MG tablet TAKE 1 TABLET(25 MG) BY MOUTH EVERY MORNING 90 tablet 3   testosterone cypionate (DEPOTESTOTERONE CYPIONATE) 100 MG/ML injection Inject 200 mg into the muscle every 14 (fourteen) days. For IM use only 1 ML every two weeks     fluticasone (FLONASE) 50 MCG/ACT nasal spray Place 1 spray into both nostrils daily.     No current facility-administered medications on file prior to visit.    PAST MEDICAL HISTORY: Past Medical History:  Diagnosis Date   Arthritis    back, neck &  hands    Coronary artery calcification    GERD (gastroesophageal reflux disease)    Headache    controlled on Depakote   High cholesterol    History of hiatal hernia    Hypertension    Spondylolisthesis of lumbar region     PAST SURGICAL HISTORY: Past Surgical History:  Procedure Laterality Date   BACK SURGERY     EYE SURGERY     bot eyes- cataracts removed- /w IOL   HEMORROIDECTOMY  1988   HERNIA REPAIR  1998   umbilical hernia    NECK SURGERY     TONSILLECTOMY      FAMILY HISTORY: The  patient's family history includes Heart disease in his father and mother.   SOCIAL HISTORY:  The patient  reports that he quit smoking about 34 years ago. His smoking use included cigarettes. He started smoking about 44 years ago. He has never used smokeless tobacco. He reports that he does not currently use alcohol. He reports that he does not use drugs.  Review of Systems  Cardiovascular:  Negative for chest pain, claudication, dyspnea on exertion, leg swelling, near-syncope, orthopnea, palpitations, paroxysmal nocturnal dyspnea and syncope.  Respiratory:  Negative for shortness of breath.     PHYSICAL EXAM:    01/12/2023    2:56 PM 01/12/2023    2:16 PM 07/31/2022    9:21 AM  Vitals with BMI  Height  6\' 2"  6\' 2"   Weight  252 lbs 253 lbs  BMI  32.34 32.47  Systolic 130 148 329  Diastolic 80 92 97  Pulse  87 63    Physical Exam  Neck: No JVD present.  Cardiovascular: Normal rate, regular rhythm, S1 normal, S2 normal, intact distal pulses and normal pulses. Exam reveals no gallop, no S3 and no S4.  No murmur heard. Pulmonary/Chest: Effort normal and breath sounds normal. No stridor. He has no wheezes. He has no rales. He exhibits no tenderness.  Abdominal: Soft. Bowel sounds are normal. He exhibits no distension. There is no abdominal tenderness.  Musculoskeletal:        General: No edema. Normal range of motion.  Neurological: He is alert and oriented to person, place, and time. He has intact cranial nerves (2-12).  Skin: Skin is warm.   RADIOLOGY: CTA Chest 02/28/2019 at [facility].  Mild atherosclerotic calcifications of the thoracic aorta without aneurysmal dilatation.  The sinus of Valsalva measures up to 4.4 cm. The sinotubular junction measures 3.4 cm. The ascending aorta measures 3.6 cm. The descending thoracic aorta measures 2.9 cm.  No central pulmonary embolism.   CTA chest Aorta w/ and w/o contrast: 11/07/2021: 1. Dilated ascending thoracic aorta,  measuring up to 4.9 cm at the sinuses of Valsalva. Recommend semi-annual imaging followup by CTA or MRA and referral to cardiothoracic surgery if not already obtained. This recommendation follows 2010 ACCF/AHA/AATS/ACR/ASA/SCA/SCAI/SIR/STS/SVM Guidelines for the Diagnosis and Management of Patients With Thoracic Aortic Disease. Circulation. 2010; 121: J188-C166. Aortic aneurysm NOS (ICD10-I71.9) 2. No acute intrathoracic abnormality. Additional incidental, chronic and senescent findings as above. Small hypodense lesions within liver, largest measuring 1.5 cm, and likely consistent with small cysts versus hemangiomas.  MRA chest with and without contrast: March 2024: Stable appearance of the thoracic aorta with minimal fusiform  dilatation of the ascending thoracic aorta measuring 40 mm, previously 39 mm.   Recommend annual imaging followup by CTA or MRA. This recommendation follows 2010 [facility]/AHA/AATS/ACR/ASA/SCA/SCAI/SIR/STS/SVM Guidelines for the Diagnosis and Management of Patients with Thoracic  Aortic Disease. Circulation. 2010; 121: Z610-R604. Aortic aneurysm NOS (ICD10-I71.9)   CARDIAC DATABASE: EKG: 01/12/2023: Sinus rhythm, 84 bpm, without underlying injury pattern.  Echocardiogram: 12/30/2018: LVEF 55%, normal diastolic filling pattern, mildly dilated left atrium, mild MR, mild PR, aortic root 4.3 cm.  12/15/2020: LVEF 64%, mild LVH, normal diastolic pattern, normal LAP, mild MR. The aortic root is dilated (Sinus of Valsalva 4.59cm and Sinotubular junction 4.22cm). Proximal ascending aorta not well visualized.   Stress Testing:  Lexiscan (Walking with mod Bruce) Sestamibi Stress Test 12/01/2019: Non-diagnostic ECG stress. due to pharmacologic stress. There is mild diaphragmatic attenuation in the inferior wall.  Myocardial perfusion is normal. Overall LV systolic function is mildly depressed without regional wall motion abnormalities. Stress LV EF: 47%.  Visually LVEF appears  normal.  No previous exam available for comparison. Low risk.   Heart Catheterization: None  LABORATORY DATA: External Labs: Collected: 11/19/2019 Hemoglobin: 17.3 g/dL, hematocrit 54.0. Creatinine 0.81 mg/dL. eGFR: 99 mL/min per 1.73 m Lipid profile: Total cholesterol 111, triglycerides 83, HDL 30, LDL 64, non-HDL 81 Hemoglobin A1c: 5.7  Collected 03/10/2021 Care Everywhere at PCP. Total cholesterol 101, triglycerides 83, LDL 45, HDL 39, non-HDL 62  External labs: Collected: 07/13/2021 available in Care Everywhere. Sodium 138, potassium 4.3, chloride 104, bicarb 26, BUN 7, creatinine 0.7. AST 16, ALT 18 Hemoglobin 15.7 g/dL, hematocrit 98.1%.  External Labs: Collected: June 17, 2022 available in Care Everywhere Total cholesterol 111, triglycerides 85, HDL 29, LDL calculated 65, non-HDL 82  External Labs: Collected: 12/21/2022 available in Care Everywhere. Sodium 136, potassium 4.2, chloride 101, bicarb 24 BUN 9, creatinine 0.69. eGFR >90. AST 14, ALT 13, alkaline phosphatase 53 Hemoglobin 16.1, hematocrit 47.3%  IMPRESSION:    ICD-10-CM   1. Ascending aorta dilatation (HCC)  I77.810 ECHOCARDIOGRAM COMPLETE    2. Dilatation of aortic sinus of Valsalva  Q25.49 EKG 12-Lead    ECHOCARDIOGRAM COMPLETE    3. Coronary atherosclerosis due to calcified coronary lesion of native artery  I25.10 ECHOCARDIOGRAM COMPLETE   I25.84     4. Essential hypertension  I10 carvedilol (COREG) 6.25 MG tablet    5. Former smoker  Z87.891     6. Hypercholesteremia  E78.00        RECOMMENDATIONS: Daniel Villanueva is a 62 y.o. male whose past medical history and cardiovascular risk factors include: Coronary artery calcification, aortic root dilatation, hypertension, hyperlipidemia, former smoker, obesity due to excess calories.  Ascending aorta dilatation (HCC) Dilatation of aortic sinus of Valsalva Most recent study in March 2024 MRI of the chest noted proximal ascending aorta was 40 mm.   Aortic root was not evaluated due to cardiac motion. Echo will be ordered to evaluate for structural heart disease and aortic root and proximal aorta dimensions. Will follow-up with a yearly CTA of the chest with gating in March 2025 Patient is aware not to lift heavy objects and to avoid antibiotics such as ciprofloxacin. Will increase carvedilol to 6.25 mg p.o. twice daily with anticipation of keeping his SBP 120 mmHg  Coronary atherosclerosis due to calcified coronary lesion of native artery Currently on aspirin and statin therapy.   Has undergone appropriate ischemic workup as outlined above. EKG is nonischemic. No additional testing recommended at this time  Essential hypertension Office blood pressures are acceptable. Medication changes as discussed above  Hypercholesteremia Currently on atorvastatin. Most recent labs from February 2024 independently reviewed, LDL at 65 mg/dL. Currently managed by primary care provider.  FINAL MEDICATION LIST END OF ENCOUNTER: Meds  ordered this encounter  Medications   carvedilol (COREG) 6.25 MG tablet    Sig: Take 1 tablet (6.25 mg total) by mouth 2 (two) times daily with a meal.    Dispense:  90 tablet    Refill:  0     Current Outpatient Medications:    amLODipine (NORVASC) 5 MG tablet, TAKE 1 TABLET(5 MG) BY MOUTH EVERY MORNING, Disp: 90 tablet, Rfl: 0   aspirin EC 81 MG tablet, Take 1 tablet (81 mg total) by mouth daily. Swallow whole., Disp: 90 tablet, Rfl: 3   atorvastatin (LIPITOR) 40 MG tablet, TAKE 1 TABLET(40 MG) BY MOUTH AT BEDTIME, Disp: 90 tablet, Rfl: 1   divalproex (DEPAKOTE) 250 MG DR tablet, Take 1 tablet (250 mg total) by mouth every 12 (twelve) hours. (Patient taking differently: Take 250 mg by mouth 2 (two) times daily.), Disp: 60 tablet, Rfl: 0   [START ON 01/22/2023] HYDROcodone-acetaminophen (NORCO) 7.5-325 MG tablet, Take by mouth., Disp: , Rfl:    losartan (COZAAR) 100 MG tablet, TAKE 1 TABLET(100 MG) BY MOUTH EVERY  EVENING, Disp: 90 tablet, Rfl: 0   omeprazole (PRILOSEC) 20 MG capsule, Take 20 mg by mouth every morning., Disp: , Rfl:    psyllium (METAMUCIL) 58.6 % powder, Take 1 packet by mouth every evening., Disp: , Rfl:    spironolactone (ALDACTONE) 25 MG tablet, TAKE 1 TABLET(25 MG) BY MOUTH EVERY MORNING, Disp: 90 tablet, Rfl: 3   testosterone cypionate (DEPOTESTOTERONE CYPIONATE) 100 MG/ML injection, Inject 200 mg into the muscle every 14 (fourteen) days. For IM use only 1 ML every two weeks, Disp: , Rfl:    carvedilol (COREG) 6.25 MG tablet, Take 1 tablet (6.25 mg total) by mouth 2 (two) times daily with a meal., Disp: 90 tablet, Rfl: 0   fluticasone (FLONASE) 50 MCG/ACT nasal spray, Place 1 spray into both nostrils daily., Disp: , Rfl:   Orders Placed This Encounter  Procedures   EKG 12-Lead   ECHOCARDIOGRAM COMPLETE   --Continue cardiac medications as reconciled in final medication list. --Return in about 28 weeks (around 07/27/2023) for Follow up Aortic root and ascending dilatation. . Or sooner if needed. --Continue follow-up with your primary care physician regarding the management of your other chronic comorbid conditions.  Patient's questions and concerns were addressed to his satisfaction. He voices understanding of the instructions provided during this encounter.   This note was created using a voice recognition software as a result there may be grammatical errors inadvertently enclosed that do not reflect the nature of this encounter. Every attempt is made to correct such errors.  Tessa Lerner, [place], [facility]  Pager: [phone] Office: [phone]

## 2023-02-01 ENCOUNTER — Ambulatory Visit (HOSPITAL_COMMUNITY): Payer: Managed Care, Other (non HMO) | Attending: Cardiology

## 2023-02-01 DIAGNOSIS — Q2549 Other congenital malformations of aorta: Secondary | ICD-10-CM

## 2023-02-01 DIAGNOSIS — I2584 Coronary atherosclerosis due to calcified coronary lesion: Secondary | ICD-10-CM | POA: Insufficient documentation

## 2023-02-01 DIAGNOSIS — I2510 Atherosclerotic heart disease of native coronary artery without angina pectoris: Secondary | ICD-10-CM

## 2023-02-01 DIAGNOSIS — I77810 Thoracic aortic ectasia: Secondary | ICD-10-CM

## 2023-02-01 LAB — ECHOCARDIOGRAM COMPLETE
Area-P 1/2: 4.33 cm2
S' Lateral: 3.3 cm

## 2023-02-05 NOTE — Progress Notes (Signed)
Echocardiogram in 2022 noted aortic root at 46 mm and it is now 47 mm.  Proximal ascending aorta MRA March 2024 measured 40 mm and current echo reports ascending aorta to be 40 mm as well.  Continue surveillance-continue with CT chest scheduled in March 2025.  Re emphasize importance of blood pressure management.  Call if questions arise.  Ziyon Cedotal [place], DO, [facility]

## 2023-02-16 ENCOUNTER — Other Ambulatory Visit: Payer: Self-pay | Admitting: Cardiology

## 2023-03-18 ENCOUNTER — Other Ambulatory Visit: Payer: Self-pay | Admitting: Cardiology

## 2023-03-18 DIAGNOSIS — Q2549 Other congenital malformations of aorta: Secondary | ICD-10-CM

## 2023-03-18 DIAGNOSIS — E7800 Pure hypercholesterolemia, unspecified: Secondary | ICD-10-CM

## 2023-03-18 DIAGNOSIS — I10 Essential (primary) hypertension: Secondary | ICD-10-CM

## 2023-03-22 ENCOUNTER — Other Ambulatory Visit: Payer: Self-pay | Admitting: Cardiology

## 2023-03-22 DIAGNOSIS — I10 Essential (primary) hypertension: Secondary | ICD-10-CM

## 2023-07-12 ENCOUNTER — Ambulatory Visit: Payer: Managed Care, Other (non HMO) | Admitting: Cardiology

## 2023-08-07 ENCOUNTER — Ambulatory Visit (HOSPITAL_COMMUNITY)

## 2023-08-08 ENCOUNTER — Telehealth: Payer: Self-pay | Admitting: Cardiology

## 2023-08-08 NOTE — Telephone Encounter (Signed)
 Per Rosann Auerbach order needs to be changed to [facility] instead of [facility]. Please Advise

## 2023-08-08 NOTE — Telephone Encounter (Signed)
 Office calling back to give fax number that order needs to be sent. [phone]

## 2023-08-09 ENCOUNTER — Other Ambulatory Visit: Payer: Self-pay

## 2023-08-09 ENCOUNTER — Encounter: Payer: Self-pay | Admitting: Cardiology

## 2023-08-09 DIAGNOSIS — Q2549 Other congenital malformations of aorta: Secondary | ICD-10-CM

## 2023-08-09 NOTE — Telephone Encounter (Signed)
 Spoke with a representative at [facility] and stated that the order has been updated in the system to [facility]. Authorization #: [id number]. [employer] verified they see the [facility] request on their end and there is nothing else that needs to be done.

## 2023-08-09 NOTE — Progress Notes (Signed)
 Pt's insurance asked for imaging to be done at [facility].

## 2023-08-24 ENCOUNTER — Ambulatory Visit
Admission: RE | Admit: 2023-08-24 | Discharge: 2023-08-24 | Disposition: A | Discharge status: Home / Self Care | Source: Ambulatory Visit | Attending: Cardiology | Admitting: Cardiology

## 2023-08-24 DIAGNOSIS — Q2549 Other congenital malformations of aorta: Secondary | ICD-10-CM

## 2023-08-24 MED ORDER — IOPAMIDOL (ISOVUE-370) INJECTION 76%
500.0000 mL | Freq: Once | INTRAVENOUS | Status: AC | PRN
  Administered 2023-08-24: 75 mL via INTRAVENOUS

## 2023-08-25 ENCOUNTER — Encounter: Payer: Self-pay | Admitting: Cardiology

## 2023-08-28 ENCOUNTER — Ambulatory Visit: Attending: Cardiology | Admitting: Cardiology

## 2023-08-28 ENCOUNTER — Encounter: Payer: Self-pay | Admitting: Cardiology

## 2023-08-28 VITALS — BP 122/88 | HR 62 | Resp 16 | Ht 74.0 in | Wt 234.0 lb

## 2023-08-28 DIAGNOSIS — I77810 Thoracic aortic ectasia: Secondary | ICD-10-CM | POA: Diagnosis not present

## 2023-08-28 DIAGNOSIS — I2510 Atherosclerotic heart disease of native coronary artery without angina pectoris: Secondary | ICD-10-CM | POA: Diagnosis not present

## 2023-08-28 DIAGNOSIS — I2584 Coronary atherosclerosis due to calcified coronary lesion: Secondary | ICD-10-CM

## 2023-08-28 DIAGNOSIS — E7800 Pure hypercholesterolemia, unspecified: Secondary | ICD-10-CM | POA: Diagnosis not present

## 2023-08-28 DIAGNOSIS — Z87891 Personal history of nicotine dependence: Secondary | ICD-10-CM

## 2023-08-28 DIAGNOSIS — E6609 Other obesity due to excess calories: Secondary | ICD-10-CM

## 2023-08-28 DIAGNOSIS — I10 Essential (primary) hypertension: Secondary | ICD-10-CM | POA: Diagnosis not present

## 2023-08-28 DIAGNOSIS — Z6830 Body mass index (BMI) 30.0-30.9, adult: Secondary | ICD-10-CM

## 2023-08-28 DIAGNOSIS — E66811 Obesity, class 1: Secondary | ICD-10-CM

## 2023-08-28 NOTE — Patient Instructions (Addendum)
 Medication Instructions:  Your physician recommends that you continue on your current medications as directed. Please refer to the Current Medication list given to you today.  *If you need a refill on your cardiac medications before your next appointment, please call your pharmacy*  Lab Work: None ordered today. If you have labs (blood work) drawn today and your tests are completely normal, you will receive your results only by: MyChart Message (if you have MyChart) OR A paper copy in the mail If you have any lab test that is abnormal or we need to change your treatment, we will call you to review the results.  Testing/Procedures: Your physician has requested that you have a CT ANGIO CHEST AORTA W/ & OR WO/CM & GATING in April 2026. Non-Cardiac CT Angiography (CTA), is a special type of CT scan that uses a computer to produce multi-dimensional views of major blood vessels throughout the body. In CT angiography, a contrast material is injected through an IV to help visualize the blood vessels  Your physician has requested that you have an echocardiogram in September 2025. Echocardiography is a painless test that uses sound waves to create images of your heart. It provides your doctor with information about the size and shape of your heart and how well your heart's chambers and valves are working. This procedure takes approximately one hour. There are no restrictions for this procedure. Please do NOT wear cologne, perfume, aftershave, or lotions (deodorant is allowed). Please arrive 15 minutes prior to your appointment time.  Please note: We ask at that you not bring children with you during ultrasound (echo/ vascular) testing. Due to room size and safety concerns, children are not allowed in the ultrasound rooms during exams. Our front office staff cannot provide observation of children in our lobby area while testing is being conducted. An adult accompanying a patient to their appointment will  only be allowed in the ultrasound room at the discretion of the ultrasound technician under special circumstances. We apologize for any inconvenience.   Follow-Up: At [facility], you and your health needs are our priority.  As part of our continuing mission to provide you with exceptional heart care, we have created designated Provider Care Teams.  These Care Teams include your primary Cardiologist (physician) and Advanced Practice Providers (APPs -  Physician Assistants and Nurse Practitioners) who all work together to provide you with the care you need, when you need it.  We recommend signing up for the patient portal called "MyChart".  Sign up information is provided on this After Visit Summary.  MyChart is used to connect with patients for Virtual Visits (Telemedicine).  Patients are able to view lab/test results, encounter notes, upcoming appointments, etc.  Non-urgent messages can be sent to your provider as well.   To learn more about what you can do with MyChart, go to [email or web address].    Your next appointment:   May 2026  The format for your next appointment:   In Person  Provider:   Olinda Bertrand, [facility]  Other Instructions   1st Floor: - Lobby - Registration  - Pharmacy  - Lab - Cafe  2nd Floor: - PV Lab - Diagnostic Testing (echo, CT, nuclear med)  3rd Floor: - Vacant  4th Floor: - TCTS (cardiothoracic surgery) - AFib Clinic - Structural Heart Clinic - Vascular Surgery  - Vascular Ultrasound  5th Floor: - HeartCare Cardiology (general and EP) - Clinical Pharmacy for coumadin, hypertension, lipid, weight-loss medications, and med management appointments  Valet parking services will be available as well.

## 2023-08-28 NOTE — Progress Notes (Signed)
 Cardiology Office Note:  .   Date:  08/28/2023  ID:  Daniel Villanueva, DOB Apr 22, 1961, MRN [id number] PCP:  Angelique Barer, MD  Former Cardiology Providers: Zena High, APRN, FNP-C  [facility] Providers Cardiologist:  Olinda Bertrand, DO , [facility] (established care established care 11/12/2019) Electrophysiologist:  None  Click to update primary MD,subspecialty MD or APP then REFRESH:1}    Chief Complaint  Patient presents with   Follow-up    Aortic root and proximal ascending aorta dilatation    History of Present Illness: .   Daniel Villanueva is a 63 y.o. Caucasian male whose past medical history and cardiovascular risk factors includes: Coronary artery calcification, aortic root dilatation, hypertension, hyperlipidemia, former smoker, obesity due to excess calories.   Patient has a history of a dilated aortic root and proximal ascending aorta.   In 2020 patient had a CTA at [facility] ([place] which noted sinuses of Valsalva at 44 mm and proximal ascending aorta measured 36 mm. He was referred to cardiothoracic surgery in March 2024. Prior to that visit he had an MRI of the chest which noted proximal ascending aorta to be 40 mm of aortic root and sinotubular junction was not commented on due to cardiac motion. Annual follow-up studies are recommended.   Patient presents today for 62-month follow-up visit.  Since last office visit patient has implemented lifestyle changes by eating healthier, low carbs, walking regularly and plays pickle ball every weekend.  He just walked 6 miles this past Saturday.  He has lost 18 pounds due to lifestyle changes over the last 6 months.  He is congratulated on his efforts.  He denies anginal chest pain or heart failure symptoms.  Home blood pressures are consistently around 120-130 mmHg.  Review of Systems: .   Review of Systems  Constitutional: Positive for weight loss.  Cardiovascular:  Negative for chest pain, claudication, irregular  heartbeat, leg swelling, near-syncope, orthopnea, palpitations, paroxysmal nocturnal dyspnea and syncope.  Respiratory:  Negative for shortness of breath.   Hematologic/Lymphatic: Negative for bleeding problem.    Studies Reviewed:   EKG: EKG Interpretation Date/Time:  Tuesday August 28 2023 09:09:30 EDT Ventricular Rate:  65 PR Interval:  216 QRS Duration:  118 QT Interval:  412 QTC Calculation: 428 R Axis:   -10  Text Interpretation: Sinus rhythm with 1st degree A-V block Non-specific intra-ventricular conduction delay When compared with ECG of 02-May-2017 16:19, No significant change was found Confirmed by Olinda Bertrand [phone]) on 08/28/2023 9:19:19 AM  Echocardiogram: 02/01/2023  1. Left ventricular ejection fraction, by estimation, is 60 to 65%. Left ventricular ejection fraction by 3D volume is 61 %. The left ventricle has normal function. The left ventricle has no regional wall motion abnormalities. Left ventricular diastolic  parameters are consistent with Grade I diastolic dysfunction (impaired relaxation). The average left ventricular global longitudinal strain is -17.8 %.  2. Right ventricular systolic function is normal. The right ventricular size is normal.  3. Left atrial size was mildly dilated.  4. The mitral valve is normal in structure. Mild mitral valve regurgitation. No evidence of mitral stenosis.  5. The aortic valve is normal in structure. Aortic valve regurgitation is not visualized. No aortic stenosis is present.  6. Aortic dilatation noted. There is moderate dilatation of the aortic root, measuring 47 mm. There is mild dilatation of the ascending aorta, measuring 40 mm.  7. The inferior vena cava is normal in size with greater than 50% respiratory variability, suggesting  right atrial pressure of 3 mmHg.  Stress Testing: Lexiscan (Walking with Tilda Fogo) Sestamibi Stress Test 12/01/2019 Low risk.   RADIOLOGY: CTA Chest 02/28/2019 at [facility]   Sinus of Valsalva measures up to 4.4 cm.  Sinotubular junction measures 3.4 cm.  Ascending aorta measures 3.6 cm.  Descending thoracic aorta measures 2.9 cm.   CTA chest Aorta w/ and w/o contrast 11/07/2021: Dilated ascending thoracic aorta, measuring up to 4.9 cm at the sinuses of Valsalva. Recommend semi-annual imaging followup by CTA or MRA and referral to cardiothoracic surgery  MRA chest with and without contrast March 2024:  Stable thoracic aorta with minimal fusiform  dilatation of the ascending thoracic aorta measuring 40 mm, previously 39 mm.  CT ANGIO CHEST AORTA W/CM & OR WO/CM 08/24/2023 1. Stable dilated tubular ascending thoracic aorta measuring 4.0 cm. 2. Recommend annual imaging followup by CTA or MRA. This recommendation follows 2010 ACCF/AHA/AATS/ACR/ASA/SCA/SCAI/SIR/STS/SVM Guidelines for the Diagnosis and Management of Patients with Thoracic Aortic Disease. Circulation. 2010; 121: L875-I433. Aortic aneurysm NOS (ICD10-I71.9)  Risk Assessment/Calculations:   NA   Labs:    External Labs: Collected: 11/19/2019 Hemoglobin: 17.3 g/dL, hematocrit 29.5. Creatinine 0.81 mg/dL. eGFR: 99 mL/min per 1.73 m Lipid profile: Total cholesterol 111, triglycerides 83, HDL 30, LDL 64, non-HDL 81 Hemoglobin A1c: 5.7   Collected 03/10/2021 Care Everywhere at PCP. Total cholesterol 101, triglycerides 83, LDL 45, HDL 39, non-HDL 62   External labs: Collected: 07/13/2021 available in Care Everywhere. Sodium 138, potassium 4.3, chloride 104, bicarb 26, BUN 7, creatinine 0.7. AST 16, ALT 18 Hemoglobin 15.7 g/dL, hematocrit 18.8%.   External Labs: Collected: June 17, 2022 available in Care Everywhere Total cholesterol 111, triglycerides 85, HDL 29, LDL calculated 65, non-HDL 82   External Labs: Collected: 12/21/2022 available in Care Everywhere. Sodium 136, potassium 4.2, chloride 101, bicarb 24 BUN 9, creatinine 0.69. eGFR >90. AST 14, ALT 13, alkaline phosphatase  53 Hemoglobin 16.1, hematocrit 47.3%   Physical Exam:    Today's Vitals   08/28/23 0910  BP: 122/88  Pulse: 62  Resp: 16  SpO2: 96%  Weight: 234 lb (106.1 kg)  Height: 6\' 2"  (1.88 m)   Body mass index is 30.04 kg/m. Wt Readings from Last 3 Encounters:  08/28/23 234 lb (106.1 kg)  01/12/23 252 lb (114.3 kg)  07/31/22 253 lb (114.8 kg)    Physical Exam  Constitutional: No distress.  hemodynamically stable  Neck: No JVD present.  Cardiovascular: Normal rate, regular rhythm, S1 normal and S2 normal. Exam reveals no gallop, no S3 and no S4.  No murmur heard. Pulmonary/Chest: Effort normal and breath sounds normal. No stridor. He has no wheezes. He has no rales.  Musculoskeletal:        General: No edema.     Cervical back: Neck supple.  Skin: Skin is warm.   Impression & Recommendation(s):  Impression:   ICD-10-CM   1. Ascending aorta dilatation (HCC)  I77.810 EKG 12-Lead    ECHOCARDIOGRAM COMPLETE    CT ANGIO CHEST AORTA W/ & OR WO/CM & GATING ([place] ONLY)    CANCELED: CT ANGIO CHEST AORTA W/ & OR WO/CM & GATING ([place] ONLY)    2. Aortic root dilatation (HCC)  I77.810 ECHOCARDIOGRAM COMPLETE    CT ANGIO CHEST AORTA W/ & OR WO/CM & GATING ([place] ONLY)    CANCELED: CT ANGIO CHEST AORTA W/ & OR WO/CM & GATING ([place] ONLY)    3. Coronary atherosclerosis due to  calcified coronary lesion of native artery  I25.10    I25.84     4. Essential hypertension  I10     5. Hypercholesteremia  E78.00     6. Former smoker  Z87.891     7. Class 1 obesity due to excess calories without serious comorbidity with body mass index (BMI) of 30.0 to 30.9 in adult  E66.811    E66.09    Z68.30        Recommendation(s):  Ascending aorta dilatation (HCC) Aortic root dilatation (HCC) Patient has noted to have a dilated aortic root as well as proximal ascending aorta. He has had multiple imaging studies in the past as mentioned above but the aortic root has been  difficult to view on CT as well as MRA.  His last echocardiogram was in September 2024 at that time aortic root was 47 mm.  Will repeat an echocardiogram in September 2025 to reevaluate the dimensions.  I will schedule a 1 year follow-up CTA of the aorta with 80 in April 2026 to follow the proximal ascending aorta/aortic root as well. He has been evaluated by cardiothoracic surgeons in the past who recommended considering interventions when the dimensions are closer to 50-55 mm Blood pressures have significantly improved. Continue losartan  100 mg p.o. daily. Continue carvedilol  12.5 mg p.o. twice daily. Continue amlodipine  10 mg p.o. daily. Continue spironolactone  25 mg p.o. daily Reemphasized importance of low-salt diet. You should avoid use of Ciprofloxacin and other associated antibiotics (flouroquinolone antibiotics ) It is  best to avoid activities that cause grunting or straining (medically referred to as a "valsalva maneuver"). This happens when a person bears down against a closed throat to increase the strength of arm or abdominal muscles. There's often a tendency to do this when lifting heavy weights, doing sit-ups, push-ups or chin-ups, etc., but it may be harmful.    Coronary atherosclerosis due to calcified coronary lesion of native artery Continue antiplatelet therapy as well as lipid-lowering agents. EKG is nonischemic. No additional testing is warranted at this time. Reemphasized the importance of secondary prevention with focus on improving her modifiable cardiovascular risk factors such as glycemic control, lipid management, blood pressure control, weight loss.  Essential hypertension Office and home blood pressure well-controlled. Medications as discussed above. Reemphasized importance of low-salt diet.  Hypercholesteremia Currently on Lipitor 40 mg p.o. daily.   He denies myalgia or other side effects. Follows up with PCP over the weekend to get fasting lipids, will send  us  a copy for reference.   Cardiology is following peripherally.  Class 1 obesity due to excess calories without serious comorbidity with body mass index (BMI) of 30.0 to 30.9 in adult Body mass index is 30.04 kg/m. Has lost 18 pounds in the last office visit.  Congratulated on his efforts as he progresses through his weight loss journey I reviewed with him importance of diet, regular physical activity/exercise, weight loss.   Patient is educated on the importance of increasing physical activity gradually as tolerated with a goal of moderate intensity exercise for 30 minutes a day 5 days a week.    Orders Placed:  Orders Placed This Encounter  Procedures   CT ANGIO CHEST AORTA W/ & OR WO/CM & GATING ([place] ONLY)    Standing Status:   Future    Expected Date:   08/06/2024    Expiration Date:   08/27/2024    Reason for Exam (SYMPTOM  OR DIAGNOSIS REQUIRED):   ascending aorta dilatation, aortic root  dilatation    If indicated for the ordered procedure, I authorize the administration of contrast media per Radiology protocol:   Yes    Does the patient have a contrast media/X-ray dye allergy?:   No    Preferred imaging location?:   [facility]   EKG 12-Lead   ECHOCARDIOGRAM COMPLETE    Standing Status:   Future    Expected Date:   01/07/2024    Expiration Date:   08/27/2024    Where should this test be performed:   [facility])    Does the patient weigh less than or greater than 250 lbs?:   Patient weighs less than 250 lbs    Perflutren DEFINITY (image enhancing agent) should be administered unless hypersensitivity or allergy exist:   Administer Perflutren    Reason for exam-Echo:   Other-Full Diagnosis List    Full ICD-10/Reason for Exam:   Aortic root dilatation (HCC) [322010]     Final Medication List:   No orders of the defined types were placed in this encounter.   Medications Discontinued During This Encounter  Medication Reason   amLODipine   (NORVASC ) 5 MG tablet Dose change   carvedilol  (COREG ) 6.25 MG tablet Dose change   divalproex  (DEPAKOTE ) 250 MG DR tablet Dose change   fluticasone  (FLONASE ) 50 MCG/ACT nasal spray Patient Preference     Current Outpatient Medications:    amLODipine  (NORVASC ) 10 MG tablet, Take 10 mg by mouth daily., Disp: , Rfl:    aspirin  EC 81 MG tablet, Take 1 tablet (81 mg total) by mouth daily. Swallow whole., Disp: 90 tablet, Rfl: 3   atorvastatin  (LIPITOR) 40 MG tablet, TAKE 1 TABLET(40 MG) BY MOUTH AT BEDTIME, Disp: 90 tablet, Rfl: 3   carvedilol  (COREG ) 12.5 MG tablet, Take 12.5 mg by mouth 2 (two) times daily with a meal., Disp: , Rfl:    divalproex  (DEPAKOTE ) 250 MG DR tablet, Take 250 mg by mouth daily., Disp: , Rfl:    doxycycline  (ADOXA) 100 MG tablet, Take 100 mg by mouth 2 (two) times daily., Disp: , Rfl:    losartan  (COZAAR ) 100 MG tablet, TAKE 1 TABLET(100 MG) BY MOUTH EVERY EVENING, Disp: 90 tablet, Rfl: 3   omeprazole (PRILOSEC) 20 MG capsule, Take 20 mg by mouth every morning., Disp: , Rfl:    psyllium (METAMUCIL) 58.6 % powder, Take 1 packet by mouth every evening., Disp: , Rfl:    spironolactone  (ALDACTONE ) 25 MG tablet, TAKE 1 TABLET(25 MG) BY MOUTH EVERY MORNING, Disp: 90 tablet, Rfl: 3   testosterone cypionate (DEPOTESTOTERONE CYPIONATE) 100 MG/ML injection, Inject 200 mg into the muscle every 14 (fourteen) days. For IM use only 1 ML every two weeks, Disp: , Rfl:   Consent:   NA  Disposition:   1 year follow-up sooner if needed  His questions and concerns were addressed to his satisfaction. He voices understanding of the recommendations provided during this encounter.    Signed, Olinda Bertrand, DO, [facility]  [facility]  [address] [place], [place] [address] 08/28/2023 10:44 AM

## 2023-10-17 ENCOUNTER — Telehealth: Payer: Self-pay

## 2023-10-17 NOTE — Telephone Encounter (Signed)
   Pre-operative Risk Assessment    Patient Name: SULTAN PARGAS  DOB: June 29, 1960 MRN: [id number]   Date of last office visit: 08/28/23 Olinda Bertrand, DO Date of next office visit: NONE   Request for Surgical Clearance    Procedure:  RIGHT KNEE SCOPE  Date of Surgery:  Clearance TBD                                Surgeon:  Grafton Lawrence, MD Surgeon's Group or Practice Name:  [facility] Phone number:  [phone]  EXT 3132 Fax number:  [phone]  ATTN: Mark Sil   Type of Clearance Requested:   - Medical  - Pharmacy:  Hold Aspirin      Type of Anesthesia:  General    Additional requests/questions:    SignedCollin Deal   10/17/2023, 7:51 AM

## 2023-10-22 NOTE — Telephone Encounter (Signed)
   Name: Daniel Villanueva  DOB: [birth year]  MRN: [id number]   Primary Cardiologist: Olinda Bertrand, DO  Chart reviewed as part of pre-operative protocol coverage. [name] was last seen on 08/28/2023 by Dr. Albert Huff.  He was doing well at that time implementing lifestyle changes by eating healthier, walking regularly and playing pickle ball.  Spoke with patient via telephone today and he continues to do well.  He has lost more weight and continues to exercise although a little less than in April secondary to his knee pain.  Therefore, based on ACC/AHA guidelines, the patient would be an acceptable risk for the planned procedure without further cardiovascular testing.   Ideally aspirin  should be continued without interruption, however if the bleeding risk is too great, aspirin  may be held for 5-7 days prior to surgery. Please resume aspirin  post operatively when it is felt to be safe from a bleeding standpoint.    I will route this recommendation to the requesting party via Epic fax function and remove from pre-op pool. Please call with questions.  Morey Ar, NP 10/22/2023, 12:01 PM

## 2023-10-22 NOTE — Telephone Encounter (Signed)
 Spouse Archie Bearded calling to get update on clearance

## 2023-10-22 NOTE — Telephone Encounter (Signed)
 Acceptable risk for upcoming knee scope.  May hold aspirin  7 days prior to the procedure and restart once hemodynamically stable and appropriate hemostasis is achieved.  Daniel Wojtowicz Jameson, DO, [facility]

## 2023-10-25 NOTE — H&P (Cosign Needed)
 PREOPERATIVE H&P  Chief Complaint: medial meniscus tear of right knee  HPI: Daniel Villanueva is a 63 y.o. male who is scheduled for, Procedure(s): ARTHROSCOPY, KNEE, WITH MEDIAL MENISCECTOMY ARTHROSCOPY, KNEE, WITH MENISCUS REPAIR.   Patient has a past medical history significant for HTN, HLD, GERD.   Mr. Daniel Villanueva is a 63 year old who has had medial knee pain for the last few weeks. He was playing pickle ball and had immediate pain in the medial aspect of his knee. He could not weight bear. He has had obvious swelling afterwards. He has catching and locking. He feels like his knee is going to give away.   Symptoms are rated as moderate to severe, and have been worsening.  This is significantly impairing activities of daily living.    Please see clinic note for further details on this patient's care.    He has elected for surgical management.   Past Medical History:  Diagnosis Date   Arthritis    back, neck &hands    Coronary artery calcification    GERD (gastroesophageal reflux disease)    Headache    controlled on Depakote    High cholesterol    History of hiatal hernia    Hypertension    Spondylolisthesis of lumbar region    Past Surgical History:  Procedure Laterality Date   BACK SURGERY     EYE SURGERY     bot eyes- cataracts removed- /w IOL   HEMORROIDECTOMY  1988   HERNIA REPAIR  1998   umbilical hernia    NECK SURGERY     TONSILLECTOMY     Social History   Socioeconomic History   Marital status: Married    Spouse name: Archie Bearded   Number of children: 3   Years of education: Not on file   Highest education level: Not on file  Occupational History   Not on file  Tobacco Use   Smoking status: Former    Current packs/day: 0.00    Types: Cigarettes    Start date: 05/02/1978    Quit date: 05/02/1988    Years since quitting: 35.5   Smokeless tobacco: Never  Vaping Use   Vaping status: Never Used  Substance and Sexual Activity   Alcohol use: Not Currently    Drug use: No   Sexual activity: Not on file  Other Topics Concern   Not on file  Social History Narrative   Not on file   Social Drivers of Health   Financial Resource Strain: Low Risk  (06/01/2022)   Received from [facility]   Overall Financial Resource Strain (CARDIA)    Difficulty of Paying Living Expenses: Not hard at all  Food Insecurity: No Food Insecurity (06/01/2022)   Received from [facility]   Hunger Vital Sign    Within the past 12 months, you worried that your food would run out before you got the money to buy more.: Never true    Within the past 12 months, the food you bought just didn't last and you didn't have money to get more.: Never true  Transportation Needs: No Transportation Needs (06/01/2022)   Received from [facility] - Transportation    Lack of Transportation (Medical): No    Lack of Transportation (Non-Medical): No  Physical Activity: Insufficiently Active (06/01/2022)   Received from [facility]   Exercise Vital Sign    On average, how many days per week do you engage in moderate to strenuous exercise (like a  brisk walk)?: 2 days    On average, how many minutes do you engage in exercise at this level?: 20 min  Stress: No Stress Concern Present (06/01/2022)   Received from [facility] - Occupational Stress Questionnaire    Feeling of Stress : Not at all  Social Connections: Socially Integrated (06/01/2022)   Received from [facility]   Social Network    How would you rate your social network (family, work, friends)?: Good participation with social networks   Family History  Problem Relation Age of Onset   Heart disease Mother    Heart disease Father    Allergies  Allergen Reactions   Adhesive [Tape] Dermatitis    Skin irritation    Prior to Admission medications   Medication Sig Start Date End Date Taking? Authorizing Provider  amLODipine  (NORVASC ) 10 MG tablet Take 10 mg by mouth  daily. 08/11/23   [provider]  aspirin  EC 81 MG tablet Take 1 tablet (81 mg total) by mouth daily. Swallow whole. 07/27/20   Tolia, Sunit, DO  atorvastatin  (LIPITOR) 40 MG tablet TAKE 1 TABLET(40 MG) BY MOUTH AT BEDTIME 03/21/23   Tolia, Sunit, DO  carvedilol  (COREG ) 12.5 MG tablet Take 12.5 mg by mouth 2 (two) times daily with a meal.    [provider]  divalproex  (DEPAKOTE ) 250 MG DR tablet Take 250 mg by mouth daily.    [provider]  doxycycline  (ADOXA) 100 MG tablet Take 100 mg by mouth 2 (two) times daily.    [provider]  losartan  (COZAAR ) 100 MG tablet TAKE 1 TABLET(100 MG) BY MOUTH EVERY EVENING 03/21/23   Tolia, Sunit, DO  omeprazole (PRILOSEC) 20 MG capsule Take 20 mg by mouth every morning.    [provider]  psyllium (METAMUCIL) 58.6 % powder Take 1 packet by mouth every evening.    [provider]  spironolactone  (ALDACTONE ) 25 MG tablet TAKE 1 TABLET(25 MG) BY MOUTH EVERY MORNING 02/16/23   Tolia, Sunit, DO  testosterone cypionate (DEPOTESTOTERONE CYPIONATE) 100 MG/ML injection Inject 200 mg into the muscle every 14 (fourteen) days. For IM use only 1 ML every two weeks    [provider]    ROS: All other systems have been reviewed and were otherwise negative with the exception of those mentioned in the HPI and as above.  Physical Exam: General: Alert, no acute distress Cardiovascular: No pedal edema Respiratory: No cyanosis, no use of accessory musculature GI: No organomegaly, abdomen is soft and non-tender Skin: No lesions in the area of chief complaint Neurologic: Sensation intact distally Psychiatric: Patient is competent for consent with normal mood and affect Lymphatic: No axillary or cervical lymphadenopathy  MUSCULOSKELETAL:  Tender to palpation over the medial joint line.  Pain with valgus stress.    Imaging: MRI demonstrates an MCL sprain and a medial meniscal root tear.     Assessment: medial meniscus tear of right knee  Plan: Plan for Procedure(s): ARTHROSCOPY, KNEE, WITH MEDIAL MENISCECTOMY ARTHROSCOPY, KNEE, WITH MENISCUS REPAIR  The risks benefits and alternatives were discussed with the patient including but not limited to the risks of nonoperative treatment, versus surgical intervention including infection, bleeding, nerve injury,  blood clots, cardiopulmonary complications, morbidity, mortality, among others, and they were willing to proceed.   The patient acknowledged the explanation, agreed to proceed with the plan and consent was signed.   Operative Plan: Right knee scope with medial meniscectomy versus meniscus repair Discharge Medications:  standard DVT Prophylaxis: aspirin  Physical Therapy: outpatient PT Special Discharge needs: +/-   Adine Ahmadi, PA-C  10/25/2023 12:52 PM

## 2023-10-26 ENCOUNTER — Other Ambulatory Visit: Payer: Self-pay

## 2023-10-26 ENCOUNTER — Encounter (HOSPITAL_BASED_OUTPATIENT_CLINIC_OR_DEPARTMENT_OTHER): Payer: Self-pay | Admitting: Orthopaedic Surgery

## 2023-10-26 NOTE — Progress Notes (Signed)
   10/26/23 1045  PAT Phone Screen  Is the patient taking a GLP-1 receptor agonist? No  Do You Have Diabetes? No  Do You Have Hypertension? Yes  Have You Ever Been to the ER for Asthma? No  Have You Taken Oral Steroids in the Past 3 Months? No  Do you Take Phenteramine or any Other Diet Drugs? No  Recent  Lab Work, EKG, CXR? No  Do you have a history of heart problems? Yes  Cardiologist Name [facility]  Have you ever had tests on your heart? Yes  What cardiac tests were performed? EKG  What date/year were cardiac tests completed? 08/2023  Results viewable: [facility] Media Tab  Any Recent Hospitalizations? No  Height 6' 2 (1.88 m)  Weight 100.7 kg  Pat Appointment Scheduled Yes

## 2023-10-30 NOTE — Discharge Instructions (Signed)
 Bonner Hair MD, MPH Aleck Stalling, PA-C [facility] [address], [address] [phone] (tel)   [phone] (fax)   POST-OPERATIVE INSTRUCTIONS - Knee Arthroscopy  WOUND CARE - You may remove the Operative Dressing on Post-Op Day #3 (72hrs after surgery).   -  Alternatively if you would like you can leave dressing on until follow-up if within 7-8 days but keep it dry. - Leave steri-strips in place until they fall off on their own, usually 2 weeks postop. - An ACE wrap may be used to control swelling, do not wrap this too tight.  If the initial ACE wrap feels too tight you may loosen it. - There may be a small amount of fluid/bleeding leaking at the surgical site.  - This is normal; the knee is filled with fluid during the procedure and can leak for 24-48hrs after surgery. You may change/reinforce the bandage as needed.  - Use the Cryocuff or Ice as often as possible for the first 7 days, then as needed for pain relief. Always keep a towel, ACE wrap or other barrier between the cooling unit and your skin.  - You may shower on Post-Op Day #3. Gently pat the area dry.  - Do not soak the knee in water or submerge it.  - Do not go swimming in the pool or ocean until 4 weeks after surgery or when otherwise instructed.  Keep dry incisions as dry as possible.   BRACE/AMBULATION  -            You will not need a brace after this procedure.   - You may use crutches initially to help you weight bear, but this is not required - You can put full weight on your operative leg as you feel comfortable  PHYSICAL THERAPY - You will begin physical therapy soon after surgery (unless otherwise specified) - Please call to set up an appointment, if you do not already have one  - Let our office if there are any issues with scheduling your therapy  - You have a physical therapy appointment scheduled at [facility] (across the hall from our office) on 6/30   REGIONAL ANESTHESIA (NERVE  BLOCKS) The anesthesia team may have performed a nerve block for you this is a great tool used to minimize pain.   The block may start wearing off overnight (between 8-24 hours postop) When the block wears off, your pain may go from nearly zero to the pain you would have had postop without the block. This is an abrupt transition but nothing dangerous is happening.   This can be a challenging period but utilize your as needed pain medications to try and manage this period. We suggest you use the pain medication the first night prior to going to bed, to ease this transition.  You may take an extra dose of narcotic when this happens if needed   POST-OP MEDICATIONS- Multimodal approach to pain control In general your pain will be controlled with a combination of substances.  Prescriptions unless otherwise discussed are electronically sent to your pharmacy.  This is a carefully made plan we use to minimize narcotic use.     Toradol - Anti-inflammatory medication taken on a scheduled basis Acetaminophen  - Non-narcotic pain medicine taken on a scheduled basis  Gabapentin - this is to help with nerve based pain, take on a scheduled basis Aspirin  81mg  - This medicine is used to minimize the risk of blood clots after surgery. Zofran  - take as  needed for nausea Robaxin  - this is a muscle relaxer, take as needed for muscle spasms   FOLLOW-UP   Please call the office to schedule a follow-up appointment for your incision check, 7-10 days post-operatively.   IF YOU HAVE ANY QUESTIONS, PLEASE FEEL FREE TO CALL OUR OFFICE.   HELPFUL INFORMATION   Keep your leg elevated to decrease swelling, which will then in turn decrease your pain. I would elevate the foot of your bed by putting a couple of couch pillows between your mattress and box spring. I would not keep pillow directly under your ankle.  - Do not sleep with a pillow behind your knee even if it is more comfortable as this may make it harder to  get your knee fully straight long term.   There will be MORE swelling on days 1-3 than there is on the day of surgery.  This also is normal. The swelling will decrease with the anti-inflammatory medication, ice and keeping it elevated. The swelling will make it more difficult to bend your knee. As the swelling goes down your motion will become easier   You may develop swelling and bruising that extends from your knee down to your calf and perhaps even to your foot over the next week. Do not be alarmed. This too is normal, and it is due to gravity   There may be some numbness adjacent to the incision site. This may last for 6-12 months or longer in some patients and is expected.   You may return to sedentary work/school in the next couple of days when you feel up to it. You will need to keep your leg elevated as much as possible    You should wean off your narcotic medicines as soon as you are able.  Most patients will be off narcotics before their first postop appointment.    We suggest you use the pain medication the first night prior to going to bed, in order to ease any pain when the anesthesia wears off. You should avoid taking pain medications on an empty stomach as it will make you nauseous.   Do not drink alcoholic beverages or take illicit drugs when taking pain medications.   It is against the law to drive while taking narcotics. You cannot drive if your Right leg is in brace locked in extension.   Pain medication may make you constipated.  Below are a few solutions to try in this order:  o Decrease the amount of pain medication if you aren't having pain.  o Drink lots of decaffeinated fluids.  o Drink prune juice and/or eat dried prunes   o If the first 3 don't work start with additional solutions  o Take Colace - an over-the-counter stool softener  o Take Senokot - an over-the-counter laxative  o Take Miralax  - a stronger over-the-counter laxative    For more  information including helpful videos and documents visit our website:   [email or web address]      Post Anesthesia Home Care Instructions  Activity: Get plenty of rest for the remainder of the day. A responsible individual must stay with you for 24 hours following the procedure.  For the next 24 hours, DO NOT: -Drive a car -Advertising copywriter -Drink alcoholic beverages -Take any medication unless instructed by your physician -Make any legal decisions or sign important papers.  Meals: Start with liquid foods such as gelatin or soup. Progress to regular foods as tolerated. Avoid greasy, spicy, heavy foods. If nausea and/or  vomiting occur, drink only clear liquids until the nausea and/or vomiting subsides. Call your physician if vomiting continues.  Special Instructions/Symptoms: Your throat may feel dry or sore from the anesthesia or the breathing tube placed in your throat during surgery. If this causes discomfort, gargle with warm salt water. The discomfort should disappear within 24 hours.  If you had a scopolamine  patch placed behind your ear for the management of post- operative nausea and/or vomiting:  1. The medication in the patch is effective for 72 hours, after which it should be removed.  Wrap patch in a tissue and discard in the trash. Wash hands thoroughly with soap and water. 2. You may remove the patch earlier than 72 hours if you experience unpleasant side effects which may include dry mouth, dizziness or visual disturbances. 3. Avoid touching the patch. Wash your hands with soap and water after contact with the patch.      Next dose of Tylenol  may be given at 1:30pm if needed.

## 2023-11-01 ENCOUNTER — Ambulatory Visit (HOSPITAL_BASED_OUTPATIENT_CLINIC_OR_DEPARTMENT_OTHER): Admitting: Certified Registered"

## 2023-11-01 ENCOUNTER — Other Ambulatory Visit: Payer: Self-pay

## 2023-11-01 ENCOUNTER — Ambulatory Visit (HOSPITAL_BASED_OUTPATIENT_CLINIC_OR_DEPARTMENT_OTHER)
Admission: RE | Admit: 2023-11-01 | Discharge: 2023-11-01 | Disposition: A | Discharge status: Home / Self Care | Attending: Orthopaedic Surgery | Admitting: Orthopaedic Surgery

## 2023-11-01 ENCOUNTER — Encounter (HOSPITAL_BASED_OUTPATIENT_CLINIC_OR_DEPARTMENT_OTHER): Payer: Self-pay | Admitting: Orthopaedic Surgery

## 2023-11-01 ENCOUNTER — Encounter (HOSPITAL_BASED_OUTPATIENT_CLINIC_OR_DEPARTMENT_OTHER)
Admission: RE | Disposition: A | Discharge status: Home / Self Care | Payer: Self-pay | Source: Home / Self Care | Attending: Orthopaedic Surgery

## 2023-11-01 DIAGNOSIS — F1120 Opioid dependence, uncomplicated: Secondary | ICD-10-CM | POA: Insufficient documentation

## 2023-11-01 DIAGNOSIS — I10 Essential (primary) hypertension: Secondary | ICD-10-CM | POA: Insufficient documentation

## 2023-11-01 DIAGNOSIS — X58XXXA Exposure to other specified factors, initial encounter: Secondary | ICD-10-CM | POA: Diagnosis not present

## 2023-11-01 DIAGNOSIS — K219 Gastro-esophageal reflux disease without esophagitis: Secondary | ICD-10-CM | POA: Insufficient documentation

## 2023-11-01 DIAGNOSIS — Z79899 Other long term (current) drug therapy: Secondary | ICD-10-CM | POA: Insufficient documentation

## 2023-11-01 DIAGNOSIS — F32A Depression, unspecified: Secondary | ICD-10-CM | POA: Diagnosis not present

## 2023-11-01 DIAGNOSIS — Z87891 Personal history of nicotine dependence: Secondary | ICD-10-CM | POA: Diagnosis not present

## 2023-11-01 DIAGNOSIS — E7800 Pure hypercholesterolemia, unspecified: Secondary | ICD-10-CM | POA: Insufficient documentation

## 2023-11-01 DIAGNOSIS — K449 Diaphragmatic hernia without obstruction or gangrene: Secondary | ICD-10-CM | POA: Insufficient documentation

## 2023-11-01 DIAGNOSIS — S83241A Other tear of medial meniscus, current injury, right knee, initial encounter: Secondary | ICD-10-CM | POA: Diagnosis present

## 2023-11-01 DIAGNOSIS — Z01818 Encounter for other preprocedural examination: Secondary | ICD-10-CM

## 2023-11-01 DIAGNOSIS — G8929 Other chronic pain: Secondary | ICD-10-CM | POA: Diagnosis not present

## 2023-11-01 HISTORY — PX: KNEE ARTHROSCOPY WITH MEDIAL MENISECTOMY: SHX5651

## 2023-11-01 SURGERY — ARTHROSCOPY, KNEE, WITH MEDIAL MENISCECTOMY
Anesthesia: General | Site: Knee | Laterality: Right

## 2023-11-01 MED ORDER — GABAPENTIN 300 MG PO CAPS
300.0000 mg | ORAL_CAPSULE | Freq: Once | ORAL | Status: AC
Start: 2023-11-01 — End: 2023-11-01
  Administered 2023-11-01: 300 mg via ORAL

## 2023-11-01 MED ORDER — CEFAZOLIN SODIUM-DEXTROSE 2-4 GM/100ML-% IV SOLN
INTRAVENOUS | Status: AC
  Filled 2023-11-01: qty 100

## 2023-11-01 MED ORDER — ACETAMINOPHEN 500 MG PO TABS
1000.0000 mg | ORAL_TABLET | Freq: Once | ORAL | Status: AC
Start: 2023-11-01 — End: 2023-11-01
  Administered 2023-11-01: 1000 mg via ORAL

## 2023-11-01 MED ORDER — PROPOFOL 10 MG/ML IV BOLUS
INTRAVENOUS | Status: AC
  Filled 2023-11-01: qty 20

## 2023-11-01 MED ORDER — DEXAMETHASONE SODIUM PHOSPHATE 10 MG/ML IJ SOLN
INTRAMUSCULAR | Status: AC
Start: 2023-11-01 — End: 2023-11-01
  Filled 2023-11-01: qty 1

## 2023-11-01 MED ORDER — FENTANYL CITRATE (PF) 100 MCG/2ML IJ SOLN: 25.0000 ug | INTRAMUSCULAR | Status: DC | PRN

## 2023-11-01 MED ORDER — MIDAZOLAM HCL 5 MG/5ML IJ SOLN
INTRAMUSCULAR | Status: DC | PRN
  Administered 2023-11-01: 2 mg via INTRAVENOUS

## 2023-11-01 MED ORDER — KETOROLAC TROMETHAMINE 30 MG/ML IJ SOLN: 30.0000 mg | Freq: Once | INTRAMUSCULAR | Status: DC | PRN

## 2023-11-01 MED ORDER — MIDAZOLAM HCL 2 MG/2ML IJ SOLN
INTRAMUSCULAR | Status: AC
  Filled 2023-11-01: qty 2

## 2023-11-01 MED ORDER — LIDOCAINE 2% (20 MG/ML) 5 ML SYRINGE
INTRAMUSCULAR | Status: AC
  Filled 2023-11-01: qty 5

## 2023-11-01 MED ORDER — EPHEDRINE SULFATE (PRESSORS) 50 MG/ML IJ SOLN
INTRAMUSCULAR | Status: DC | PRN
  Administered 2023-11-01 (×2): 5 mg via INTRAVENOUS

## 2023-11-01 MED ORDER — LIDOCAINE HCL (CARDIAC) PF 100 MG/5ML IV SOSY
PREFILLED_SYRINGE | INTRAVENOUS | Status: DC | PRN
  Administered 2023-11-01: 60 mg via INTRAVENOUS

## 2023-11-01 MED ORDER — KETOROLAC TROMETHAMINE 10 MG PO TABS
10.0000 mg | ORAL_TABLET | Freq: Four times a day (QID) | ORAL | 0 refills | Status: AC | PRN
Start: 2023-11-01 — End: ?

## 2023-11-01 MED ORDER — OXYCODONE HCL 5 MG/5ML PO SOLN: 5.0000 mg | Freq: Once | ORAL | Status: DC | PRN

## 2023-11-01 MED ORDER — ONDANSETRON HCL 4 MG PO TABS: 4.0000 mg | ORAL_TABLET | Freq: Three times a day (TID) | ORAL | 0 refills | Status: AC | PRN

## 2023-11-01 MED ORDER — METHOCARBAMOL 500 MG PO TABS
500.0000 mg | ORAL_TABLET | Freq: Three times a day (TID) | ORAL | 0 refills | Status: AC | PRN
Start: 2023-11-01 — End: ?

## 2023-11-01 MED ORDER — PROPOFOL 10 MG/ML IV BOLUS
INTRAVENOUS | Status: DC | PRN
  Administered 2023-11-01: 200 mg via INTRAVENOUS

## 2023-11-01 MED ORDER — ONDANSETRON HCL 4 MG/2ML IJ SOLN
INTRAMUSCULAR | Status: DC | PRN
  Administered 2023-11-01: 4 mg via INTRAVENOUS

## 2023-11-01 MED ORDER — EPHEDRINE 5 MG/ML INJ
INTRAVENOUS | Status: AC
Start: 2023-11-01 — End: 2023-11-01
  Filled 2023-11-01: qty 5

## 2023-11-01 MED ORDER — BUPIVACAINE HCL (PF) 0.25 % IJ SOLN
INTRAMUSCULAR | Status: DC | PRN
  Administered 2023-11-01: 30 mL

## 2023-11-01 MED ORDER — GABAPENTIN 100 MG PO CAPS: 100.0000 mg | ORAL_CAPSULE | Freq: Three times a day (TID) | ORAL | 0 refills | Status: AC

## 2023-11-01 MED ORDER — ACETAMINOPHEN 500 MG PO TABS
ORAL_TABLET | ORAL | Status: AC
  Filled 2023-11-01: qty 2

## 2023-11-01 MED ORDER — ASPIRIN 81 MG PO CHEW: 81.0000 mg | CHEWABLE_TABLET | Freq: Two times a day (BID) | ORAL | 0 refills | Status: AC

## 2023-11-01 MED ORDER — SODIUM CHLORIDE 0.9 % IR SOLN
Status: DC | PRN
  Administered 2023-11-01 (×2): 3000 mL

## 2023-11-01 MED ORDER — FENTANYL CITRATE (PF) 100 MCG/2ML IJ SOLN
INTRAMUSCULAR | Status: DC | PRN
  Administered 2023-11-01 (×2): 50 ug via INTRAVENOUS

## 2023-11-01 MED ORDER — AMISULPRIDE (ANTIEMETIC) 5 MG/2ML IV SOLN: 10.0000 mg | Freq: Once | INTRAVENOUS | Status: DC | PRN

## 2023-11-01 MED ORDER — LACTATED RINGERS IV SOLN: INTRAVENOUS | Status: DC

## 2023-11-01 MED ORDER — OXYCODONE HCL 5 MG PO TABS: 5.0000 mg | ORAL_TABLET | Freq: Once | ORAL | Status: DC | PRN

## 2023-11-01 MED ORDER — CEFAZOLIN SODIUM-DEXTROSE 2-4 GM/100ML-% IV SOLN
2.0000 g | INTRAVENOUS | Status: AC
  Administered 2023-11-01: 2 g via INTRAVENOUS

## 2023-11-01 MED ORDER — ONDANSETRON HCL 4 MG/2ML IJ SOLN
INTRAMUSCULAR | Status: AC
  Filled 2023-11-01: qty 2

## 2023-11-01 MED ORDER — FENTANYL CITRATE (PF) 100 MCG/2ML IJ SOLN
INTRAMUSCULAR | Status: AC
  Filled 2023-11-01: qty 2

## 2023-11-01 MED ORDER — DEXAMETHASONE SODIUM PHOSPHATE 10 MG/ML IJ SOLN
INTRAMUSCULAR | Status: DC | PRN
  Administered 2023-11-01: 10 mg via INTRAVENOUS

## 2023-11-01 MED ORDER — ACETAMINOPHEN 500 MG PO TABS: 1000.0000 mg | ORAL_TABLET | Freq: Three times a day (TID) | ORAL | 0 refills | Status: AC

## 2023-11-01 MED ORDER — GABAPENTIN 300 MG PO CAPS
ORAL_CAPSULE | ORAL | Status: AC
  Filled 2023-11-01: qty 1

## 2023-11-01 SURGICAL SUPPLY — 36 items
BANDAGE ESMARK 6X9 LF (GAUZE/BANDAGES/DRESSINGS) IMPLANT
BLADE SHAVER BONE 5.0X13 (MISCELLANEOUS) IMPLANT
BNDG ELASTIC 6INX 5YD STR LF (GAUZE/BANDAGES/DRESSINGS) ×1 IMPLANT
BURR OVAL 8 FLU 4.0X13 (MISCELLANEOUS) IMPLANT
CHLORAPREP W/TINT 26 (MISCELLANEOUS) ×1 IMPLANT
CLSR STERI-STRIP ANTIMIC 1/2X4 (GAUZE/BANDAGES/DRESSINGS) ×1 IMPLANT
CUFF TRNQT CYL 34X4.125X (TOURNIQUET CUFF) ×1 IMPLANT
CUTTER KNOT PUSHER W/ SKID (INSTRUMENTS) IMPLANT
DISSECTOR 4.0MMX13CM CVD (MISCELLANEOUS) ×1 IMPLANT
DRAPE U-SHAPE 47X51 STRL (DRAPES) ×1 IMPLANT
DRAPE-T ARTHROSCOPY W/POUCH (DRAPES) ×1 IMPLANT
ELECTRODE REM PT RTRN 9FT ADLT (ELECTROSURGICAL) IMPLANT
FILTER NEPTUNE SMOKE EVACUATOR (MISCELLANEOUS) IMPLANT
GAUZE PAD ABD 8X10 STRL (GAUZE/BANDAGES/DRESSINGS) IMPLANT
GAUZE SPONGE 4X4 12PLY STRL (GAUZE/BANDAGES/DRESSINGS) ×1 IMPLANT
GLOVE BIO SURGEON STRL SZ 6.5 (GLOVE) ×1 IMPLANT
GLOVE BIOGEL PI IND STRL 6.5 (GLOVE) ×1 IMPLANT
GLOVE BIOGEL PI IND STRL 8 (GLOVE) ×1 IMPLANT
GLOVE ECLIPSE 8.0 STRL XLNG CF (GLOVE) ×2 IMPLANT
GOWN STRL REUS W/ TWL LRG LVL3 (GOWN DISPOSABLE) ×2 IMPLANT
GOWN STRL REUS W/TWL XL LVL3 (GOWN DISPOSABLE) ×1 IMPLANT
KIT TURNOVER KIT B (KITS) ×1 IMPLANT
MANIFOLD NEPTUNE II (INSTRUMENTS) IMPLANT
NS IRRIG 1000ML POUR BTL (IV SOLUTION) IMPLANT
PACK ARTHROSCOPY DSU (CUSTOM PROCEDURE TRAY) ×1 IMPLANT
PADDING CAST ABS COTTON 4X4 ST (CAST SUPPLIES) IMPLANT
SLEEVE SCD COMPRESS KNEE MED (STOCKING) ×1 IMPLANT
STRIP CLOSURE SKIN 1/2X4 (GAUZE/BANDAGES/DRESSINGS) IMPLANT
SUT MNCRL AB 4-0 PS2 18 (SUTURE) ×1 IMPLANT
SUT PDS AB 0 CT 36 (SUTURE) IMPLANT
SUTURE 0 FIBERLP 38 BLU TPR ND (SUTURE) IMPLANT
TOWEL GREEN STERILE FF (TOWEL DISPOSABLE) ×2 IMPLANT
TUBE CONNECTING 20X1/4 (TUBING) ×1 IMPLANT
TUBING ARTHROSCOPY IRRIG 16FT (MISCELLANEOUS) ×1 IMPLANT
WAND ABLATOR APOLLO I90 (BUR) IMPLANT
WATER STERILE IRR 1000ML POUR (IV SOLUTION) ×1 IMPLANT

## 2023-11-01 NOTE — Anesthesia Preprocedure Evaluation (Addendum)
 Anesthesia Evaluation  Patient identified by MRN, date of birth, ID band Patient awake    Reviewed: Allergy & Precautions, NPO status , Patient's Chart, lab work & pertinent test results  Airway Mallampati: II  TM Distance: >3 FB Neck ROM: Full    Dental no notable dental hx.    Pulmonary former smoker   Pulmonary exam normal        Cardiovascular hypertension, Pt. on medications and Pt. on home beta blockers Normal cardiovascular exam     Neuro/Psych  Headaches PSYCHIATRIC DISORDERS  Depression       GI/Hepatic hiatal hernia,GERD  Medicated and Controlled,,(+)     substance abuse    Endo/Other  negative endocrine ROS    Renal/GU negative Renal ROS     Musculoskeletal  (+) Arthritis ,  narcotic dependentChronic pain   Abdominal   Peds  Hematology negative hematology ROS (+)   Anesthesia Other Findings medial meniscus tear of right knee  Reproductive/Obstetrics                             Anesthesia Physical Anesthesia Plan  ASA: 3  Anesthesia Plan: General   Post-op Pain Management:    Induction: Intravenous  PONV Risk Score and Plan: 2 and Ondansetron , Dexamethasone , Midazolam  and Treatment may vary due to age or medical condition  Airway Management Planned: LMA  Additional Equipment:   Intra-op Plan:   Post-operative Plan: Extubation in OR  Informed Consent: I have reviewed the patients History and Physical, chart, labs and discussed the procedure including the risks, benefits and alternatives for the proposed anesthesia with the patient or authorized representative who has indicated his/her understanding and acceptance.     Dental advisory given  Plan Discussed with: CRNA and Surgeon  Anesthesia Plan Comments: (Potential post-op regional anesthesia discussed )       Anesthesia Quick Evaluation

## 2023-11-01 NOTE — Transfer of Care (Signed)
 Immediate Anesthesia Transfer of Care Note  Patient: Daniel Villanueva  Procedure(s) Performed: ARTHROSCOPY, KNEE, WITH MEDIAL MENISCECTOMY (Right: Knee)  Patient Location: PACU  Anesthesia Type:General  Level of Consciousness: drowsy  Airway & Oxygen Therapy: Patient Spontanous Breathing and Patient connected to face mask oxygen  Post-op Assessment: Report given to RN and Post -op Vital signs reviewed and stable  Post vital signs: Reviewed and stable  Last Vitals:  Vitals Value Taken Time  BP 140/89 11/01/23 09:22  Temp    Pulse 66 11/01/23 09:24  Resp 14 11/01/23 09:24  SpO2 92 % 11/01/23 09:24  Vitals shown include unfiled device data.  Last Pain:  Vitals:   11/01/23 0719  TempSrc: Temporal  PainSc: 4       Patients Stated Pain Goal: 5 (11/01/23 0719)  Complications: No notable events documented.

## 2023-11-01 NOTE — Anesthesia Procedure Notes (Signed)
 Procedure Name: LMA Insertion Date/Time: 11/01/2023 7:48 AM  Performed by: Debarah Chiquita LABOR, CRNAPre-anesthesia Checklist: Patient identified, Emergency Drugs available, Suction available and Patient being monitored Patient Re-evaluated:Patient Re-evaluated prior to induction Oxygen Delivery Method: Circle system utilized Preoxygenation: Pre-oxygenation with 100% oxygen Induction Type: IV induction Ventilation: Mask ventilation without difficulty LMA: LMA inserted LMA Size: 5.0 Number of attempts: 1 Airway Equipment and Method: Bite block Placement Confirmation: positive ETCO2 Tube secured with: Tape Dental Injury: Teeth and Oropharynx as per pre-operative assessment

## 2023-11-01 NOTE — Op Note (Signed)
 Orthopaedic Surgery Operative Note (CSN: [id number])  Daniel Villanueva  11-Jun-1960 Date of Surgery: 11/01/2023   Diagnoses:  medial meniscus tear of right knee  Procedure: Right partial medial meniscectomy   Operative Finding Patient had a 3 degree flexion contracture, there was a meniscal root tear medially that in itself may have been repairable however there was grade 3 and 4 changes scattered throughout the medial femoral condyle and medial tibial plateau.  Intercondylar notch was normal, it exam under anesthesia was otherwise normal, lateral compartment normal, grade 4 changes in the patellofemoral compartment throughout.  Patient may be a candidate for total knee arthroplasty in the future.  Successful completion of the planned procedure.  We specifically discussed that he would not get narcotics postoperatively as this was a nonnarcotic based surgery and he is on baseline Suboxone.  Post-operative plan: The patient will be WBAT.  The patient will be DC home.  DVT prophylaxis Aspirin  81 mg twice daily for 6 weeks.  Pain control with PRN pain medication preferring oral medicines.  Follow up plan will be scheduled in approximately 7days for incision check.  Post-Op Diagnosis: Same Surgeons:Primary: Cristy Bonner DASEN, MD Assistants:Caroline McBane, PA-C Location: [facility] OR ROOM 1 Anesthesia: General with local Antibiotics: Ancef  2 g Tourniquet time:  Total Tourniquet Time Documented: Thigh (Right) - 6 minutes Total: Thigh (Right) - 6 minutes  Estimated Blood Loss: Minimal Complications: None Specimens: None Implants: * No implants in log *  Indications for Surgery:   [name] is a 63 y.o. male with meniscus tear on MRI.  Benefits and risks of operative and nonoperative management were discussed prior to surgery with patient/guardian(s) and informed consent form was completed.  Specific risks including infection, need for additional surgery, continued pain, recurrent tear, post  meniscectomy syndrome and progressive arthritis and arthritis based pain amongst others.   Procedure:   The patient was identified properly. Informed consent was obtained and the surgical site was marked. The patient was taken up to suite where general anesthesia was induced. The patient was placed in the supine position with a post against the surgical leg and a nonsterile tourniquet applied. The surgical leg was then prepped and draped usual sterile fashion.  A standard surgical timeout was performed.  2 standard anterior portals were made and diagnostic arthroscopy performed. Please note the findings as noted above.  We used a shaver and a basket to debride back the medial meniscus to a stable base as above.  Loose fragments were removed.  Incisions closed with absorbable suture. The patient was awoken from general anesthesia and taken to the PACU in stable condition without complication.   Aleck Stalling, PA-C, present and scrubbed throughout the case, critical for completion in a timely fashion, and for retraction, instrumentation, closure.

## 2023-11-01 NOTE — Interval H&P Note (Signed)
 All questions answered

## 2023-11-02 ENCOUNTER — Encounter (HOSPITAL_BASED_OUTPATIENT_CLINIC_OR_DEPARTMENT_OTHER): Payer: Self-pay | Admitting: Orthopaedic Surgery

## 2023-11-02 NOTE — Anesthesia Postprocedure Evaluation (Signed)
 Anesthesia Post Note  Patient: Daniel Villanueva  Procedure(s) Performed: ARTHROSCOPY, KNEE, WITH MEDIAL MENISCECTOMY (Right: Knee)     Patient location during evaluation: PACU Anesthesia Type: General Level of consciousness: awake Pain management: pain level controlled Vital Signs Assessment: post-procedure vital signs reviewed and stable Respiratory status: spontaneous breathing, nonlabored ventilation and respiratory function stable Cardiovascular status: blood pressure returned to baseline and stable Postop Assessment: no apparent nausea or vomiting Anesthetic complications: no   No notable events documented.  Last Vitals:  Vitals:   11/01/23 1000 11/01/23 1013  BP: 119/80 118/74  Pulse: (!) 59 65  Resp: 16 16  Temp:  36.7 C  SpO2: 100% 98%    Last Pain:  Vitals:   11/01/23 1013  TempSrc: Temporal  PainSc: 0-No pain                 Galileah Piggee P Verlyn Dannenberg

## 2024-01-08 ENCOUNTER — Other Ambulatory Visit: Payer: Self-pay | Admitting: Cardiology

## 2024-01-08 DIAGNOSIS — Q2549 Other congenital malformations of aorta: Secondary | ICD-10-CM

## 2024-01-08 DIAGNOSIS — I10 Essential (primary) hypertension: Secondary | ICD-10-CM

## 2024-01-21 ENCOUNTER — Ambulatory Visit (HOSPITAL_COMMUNITY)
Admission: RE | Admit: 2024-01-21 | Discharge: 2024-01-21 | Disposition: A | Discharge status: Home / Self Care | Source: Ambulatory Visit | Attending: Cardiology | Admitting: Cardiology

## 2024-01-21 DIAGNOSIS — I77810 Thoracic aortic ectasia: Secondary | ICD-10-CM | POA: Diagnosis present

## 2024-01-21 DIAGNOSIS — I10 Essential (primary) hypertension: Secondary | ICD-10-CM | POA: Diagnosis not present

## 2024-01-21 LAB — ECHOCARDIOGRAM COMPLETE
Area-P 1/2: 3.39 cm2
S' Lateral: 3.3 cm

## 2024-01-25 ENCOUNTER — Ambulatory Visit: Payer: Self-pay | Admitting: Cardiology

## 2024-01-25 NOTE — Progress Notes (Signed)
The patient has been notified of the result and verbalized understanding.  All questions (if any) were answered.     

## 2024-01-30 ENCOUNTER — Other Ambulatory Visit: Payer: Self-pay | Admitting: Cardiology

## 2024-03-08 ENCOUNTER — Other Ambulatory Visit: Payer: Self-pay | Admitting: Cardiology

## 2024-03-26 ENCOUNTER — Telehealth (HOSPITAL_BASED_OUTPATIENT_CLINIC_OR_DEPARTMENT_OTHER): Payer: Self-pay | Admitting: *Deleted

## 2024-03-26 NOTE — Telephone Encounter (Signed)
   Pre-operative Risk Assessment    Patient Name: Daniel Villanueva  DOB: 1960-12-17 MRN: [id number]   Date of last office visit: 08/28/23 DR. TOLIA Date of next office visit:NONE   Request for Surgical Clearance    Procedure:  RIGHT TOTAL KNEE REPLACEMENT   Date of Surgery:  Clearance TBD                                Surgeon:  DR. EVALENE CHANCY  Surgeon's Group or Practice Name:  [facility] Phone number: [phone] Fax number:  [phone] KELLY HIGH   Type of Clearance Requested:   - Medical    Type of Anesthesia:  Spinal   Additional requests/questions:    Bonney Niels Jest   03/26/2024, 5:16 PM

## 2024-03-27 ENCOUNTER — Telehealth: Payer: Self-pay

## 2024-03-27 NOTE — Telephone Encounter (Signed)
 Pt returning call

## 2024-03-27 NOTE — Telephone Encounter (Signed)
  Patient Consent for Virtual Visit         Daniel Villanueva has provided verbal consent on 03/27/2024 for a virtual visit (video or telephone).  Appointment scheduled for 04/07/2024 @ 2:40. Med and consent are complete. Call patient at [phone].    CONSENT FOR VIRTUAL VISIT FOR:  Daniel Villanueva  By participating in this virtual visit I agree to the following:  I hereby voluntarily request, consent and authorize [facility] and its employed or contracted physicians, physician assistants, nurse practitioners or other licensed health care professionals (the Practitioner), to provide me with telemedicine health care services (the "Services) as deemed necessary by the treating Practitioner. I acknowledge and consent to receive the Services by the Practitioner via telemedicine. I understand that the telemedicine visit will involve communicating with the Practitioner through live audiovisual communication technology and the disclosure of certain medical information by electronic transmission. I acknowledge that I have been given the opportunity to request an in-person assessment or other available alternative prior to the telemedicine visit and am voluntarily participating in the telemedicine visit.  I understand that I have the right to withhold or withdraw my consent to the use of telemedicine in the course of my care at any time, without affecting my right to future care or treatment, and that the Practitioner or I may terminate the telemedicine visit at any time. I understand that I have the right to inspect all information obtained and/or recorded in the course of the telemedicine visit and may receive copies of available information for a reasonable fee.  I understand that some of the potential risks of receiving the Services via telemedicine include:  Delay or interruption in medical evaluation due to technological equipment failure or disruption; Information transmitted may not be  sufficient (e.g. poor resolution of images) to allow for appropriate medical decision making by the Practitioner; and/or  In rare instances, security protocols could fail, causing a breach of personal health information.  Furthermore, I acknowledge that it is my responsibility to provide information about my medical history, conditions and care that is complete and accurate to the best of my ability. I acknowledge that Practitioner's advice, recommendations, and/or decision may be based on factors not within their control, such as incomplete or inaccurate data provided by me or distortions of diagnostic images or specimens that may result from electronic transmissions. I understand that the practice of medicine is not an exact science and that Practitioner makes no warranties or guarantees regarding treatment outcomes. I acknowledge that a copy of this consent can be made available to me via my patient portal [facility] MyChart), or I can request a printed copy by calling the office of [facility].    I understand that my insurance will be billed for this visit.   I have read or had this consent read to me. I understand the contents of this consent, which adequately explains the benefits and risks of the Services being provided via telemedicine.  I have been provided ample opportunity to ask questions regarding this consent and the Services and have had my questions answered to my satisfaction. I give my informed consent for the services to be provided through the use of telemedicine in my medical care

## 2024-03-27 NOTE — Telephone Encounter (Signed)
 Appointment scheduled for 04/07/2024 @ 2:40. Med and consent are complete. Call patient at [phone].

## 2024-03-27 NOTE — Telephone Encounter (Signed)
   Name: Daniel Villanueva  DOB: 1960/10/02  MRN: [id number]  Primary Cardiologist: Madonna Large, DO  Chart reviewed as part of pre-operative protocol coverage. Because of [name] past medical history and time since last visit, he will require a follow-up telephone visit in order to better assess preoperative cardiovascular risk.  Pre-op covering staff: - Please schedule appointment and call patient to inform them. If patient already had an upcoming appointment within acceptable timeframe, please add pre-op clearance to the appointment notes so provider is aware. - Please contact requesting surgeon's office via preferred method (i.e, phone, fax) to inform them of need for appointment prior to surgery.  No medications indicated as needing held.   I also confirmed the patient resides in the state of [place] . As per [facility] Medical Board telemedicine laws, the patient must reside in the state in which the provider is licensed.    [name], PA-C  03/27/2024, 8:35 AM

## 2024-03-27 NOTE — Telephone Encounter (Signed)
 Left message to call back to schedule a tele pre op appt.  ?

## 2024-03-28 NOTE — Telephone Encounter (Signed)
 Preop tell appt now rescheduled

## 2024-03-28 NOTE — Telephone Encounter (Signed)
 Patient is calling back to speak with the pre opp team. Please advise

## 2024-03-28 NOTE — Telephone Encounter (Signed)
 Spoke with the patient he requests having his preop tele appt moved to sooner date, next Wednesday ok per APP Orren Fabry. LVMFCB to confirm that the patient agrees and is available for next Wedneday

## 2024-04-02 ENCOUNTER — Ambulatory Visit: Attending: Internal Medicine | Admitting: Cardiology

## 2024-04-02 DIAGNOSIS — Z01810 Encounter for preprocedural cardiovascular examination: Secondary | ICD-10-CM

## 2024-04-02 DIAGNOSIS — Z01818 Encounter for other preprocedural examination: Secondary | ICD-10-CM

## 2024-04-02 NOTE — Progress Notes (Signed)
 Virtual Visit via Telephone Note   Because of Daniel Villanueva co-morbid illnesses, he is at least at moderate risk for complications without adequate follow up.  This format is felt to be most appropriate for this patient at this time.  Due to technical limitations with video connection web designer), today's appointment will be conducted as an audio only telehealth visit, and Daniel Villanueva verbally agreed to proceed in this manner.   All issues noted in this document were discussed and addressed.  No physical exam could be performed with this format.  Evaluation Performed:  Preoperative cardiovascular risk assessment _____________   Date:  04/02/2024   Patient ID:  Daniel Villanueva, DOB 05-05-1961, MRN [id number] Patient Location:  Home Provider location:   Office  Primary Care Provider:  Jama Chow, MD Primary Cardiologist:  Madonna Large, DO  Chief Complaint / Patient Profile   63 y.o. y/o male with a h/o coronary artery calcification, aortic root dilatation, hypertension, hyperlipidemia, former smoker, obesity who is pending right total knee replacement and presents today for telephonic preoperative cardiovascular risk assessment.  History of Present Illness    Daniel Villanueva is a 63 y.o. male who presents via audio/video conferencing for a telehealth visit today.  Pt was last seen in cardiology clinic on 08/28/2023 by Dr. Large.  At that time Daniel Villanueva was doing well, working on making lifestyle changes and had lost ~ 20 lbs.  The patient is now pending procedure as outlined above. Since his last visit, he has continued to do well from a cardiovascular perspective, has intentionally lost an additional 15 lbs. He denies chest pain, palpitations, dyspnea, pnd, orthopnea, n, v, dizziness, syncope, edema, weight gain, or early satiety.     Past Medical History    Past Medical History:  Diagnosis Date   Arthritis    back, neck &hands    Coronary artery calcification    GERD  (gastroesophageal reflux disease)    Headache    controlled on Depakote    High cholesterol    History of hiatal hernia    Hypertension    Spondylolisthesis of lumbar region    Past Surgical History:  Procedure Laterality Date   BACK SURGERY     EYE SURGERY     bot eyes- cataracts removed- /w IOL   HEMORROIDECTOMY  1988   HERNIA REPAIR  1998   umbilical hernia    KNEE ARTHROSCOPY WITH MEDIAL MENISECTOMY Right 11/01/2023   Procedure: ARTHROSCOPY, KNEE, WITH MEDIAL MENISCECTOMY;  Surgeon: Cristy Bonner DASEN, MD;  Location: [facility];  Service: Orthopedics;  Laterality: Right;   NECK SURGERY     TONSILLECTOMY      Allergies  Allergies  Allergen Reactions   Adhesive [Tape] Dermatitis    Skin irritation     Home Medications    Prior to Admission medications   Medication Sig Start Date End Date Taking? Authorizing Provider  amLODipine  (NORVASC ) 10 MG tablet Take 10 mg by mouth daily. 08/11/23   [provider]  atorvastatin  (LIPITOR) 40 MG tablet TAKE 1 TABLET(40 MG) BY MOUTH AT BEDTIME 03/21/23   Tolia, Sunit, DO  buprenorphine-naloxone (SUBOXONE) 2-0.5 mg SUBL SL tablet Place under the tongue.    [provider]  carvedilol  (COREG ) 12.5 MG tablet Take 12.5 mg by mouth 2 (two) times daily with a meal.    [provider]  divalproex  (DEPAKOTE ) 250 MG DR tablet Take 250 mg by mouth daily.    [provider]  doxycycline  (ADOXA) 100 MG tablet Take 100 mg by mouth 2 (two) times daily.    [provider]  ketorolac  (TORADOL ) 10 MG tablet Take 1 tablet (10 mg total) by mouth every 6 (six) hours as needed for severe pain (pain score 7-10) or moderate pain (pain score 4-6). 11/01/23   McBane, Caroline N, PA-C  losartan  (COZAAR ) 100 MG tablet TAKE 1 TABLET(100 MG) BY MOUTH EVERY EVENING 03/21/23   Tolia, Sunit, DO  methocarbamol  (ROBAXIN ) 500 MG tablet Take 1 tablet (500 mg total) by mouth every 8 (eight) hours as needed for muscle  spasms. 11/01/23   McBane, Aleck SAILOR, PA-C  omeprazole (PRILOSEC) 20 MG capsule Take 20 mg by mouth every morning.    [provider]  psyllium (METAMUCIL) 58.6 % powder Take 1 packet by mouth every evening.    [provider]  spironolactone  (ALDACTONE ) 25 MG tablet TAKE 1 TABLET BY MOUTH EVERY MORNING 03/10/24   Tolia, Sunit, DO  testosterone cypionate (DEPOTESTOTERONE CYPIONATE) 100 MG/ML injection Inject 200 mg into the muscle every 14 (fourteen) days. For IM use only 1 ML every two weeks    [provider]    Physical Exam    Vital Signs:  Daniel Villanueva does not have vital signs available for review today.  Given telephonic nature of communication, physical exam is limited. AAOx3. NAD. Normal affect.  Speech and respirations are unlabored.  Accessory Clinical Findings    None  Assessment & Plan    1.  Preoperative Cardiovascular Risk Assessment:     Daniel Villanueva perioperative risk of a major cardiac event is 0.4% according to the Revised Cardiac Risk Index (RCRI).  Therefore, he is at low risk for perioperative complications.   His functional capacity is good at 5.38 METs according to the Duke Activity Status Index (DASI). Recommendations: According to ACC/AHA guidelines, no further cardiovascular testing needed.  The patient may proceed to surgery at acceptable risk.      The patient was advised that if he develops new symptoms prior to surgery to contact our office to arrange for a follow-up visit, and he verbalized understanding.   A copy of this note will be routed to requesting surgeon.  Time:   Today, I have spent 10 minutes with the patient with telehealth technology discussing medical history, symptoms, and management plan.     Daniel JAYSON Hoover, NP  04/02/2024, 12:14 PM

## 2024-04-05 ENCOUNTER — Other Ambulatory Visit: Payer: Self-pay | Admitting: Cardiology

## 2024-04-05 DIAGNOSIS — E7800 Pure hypercholesterolemia, unspecified: Secondary | ICD-10-CM

## 2024-04-05 DIAGNOSIS — I10 Essential (primary) hypertension: Secondary | ICD-10-CM

## 2024-04-07 ENCOUNTER — Ambulatory Visit

## 2024-04-18 NOTE — Progress Notes (Signed)
 Sent message, via epic in basket, requesting orders in epic from Careers adviser.

## 2024-04-21 NOTE — Progress Notes (Signed)
 PCP - Jama Chow, MD Cardiologist - Preop eval televisit 04-02-24 epic Delon Hoover, NP/ Michele Richardson ,MD  PPM/ICD -  Device Orders -  Rep Notified -   Chest x-ray - CTA chest 01-31-24 epic EKG - 08-28-23 epic Stress Test - 2021 epic ECHO - 01-21-24 epic Cardiac Cath -   Sleep Study - n/a CPAP - n/a  Fasting Blood Sugar -  Checks Blood Sugar _____ times a day  Blood Thinner Instructions:n/a Aspirin  Instructions: 81 mg asa    ERAS Protcol - PRE-SURGERY Ensure     COVID vaccine -yes  Activity--Able to climb a flight of stairs with no CP or SOB Anesthesia review: Aortic root dilation , CAC, HTN, Former smoker   Patient denies shortness of breath, fever, cough and chest pain at PAT appointment   All instructions explained to the patient, with a verbal understanding of the material. Patient agrees to go over the instructions while at home for a better understanding. Patient also instructed to self quarantine after being tested for COVID-19. The opportunity to ask questions was provided.

## 2024-04-21 NOTE — Patient Instructions (Signed)
 SURGICAL WAITING ROOM VISITATION  Patients having surgery or a procedure may have no more than 2 support people in the waiting area - these visitors may rotate.    Children ages 78 and under will not be able to visit patients in [facility] under most circumstances.   Visitors with respiratory illnesses are discouraged from visiting and should remain at home.  If the patient needs to stay at the hospital during part of their recovery, the visitor guidelines for inpatient rooms apply. Pre-op nurse will coordinate an appropriate time for 1 support person to accompany patient in pre-op.  This support person may not rotate.    Please refer to the [facility] website for the visitor guidelines for Inpatients (after your surgery is over and you are in a regular room).       Your procedure is scheduled on: 05-06-24   Report to [facility] Main Entrance    Report to admitting at      10:00  AM   Call this number if you have problems the morning of surgery [phone]   Do not eat food :After Midnight.   After Midnight you may have the following liquids until __0930____ AM/ DAY OF SURGERY  then nothing by mouth  Water Non-Citrus Juices (without pulp, NO RED-Apple, White grape, White cranberry) Black Coffee (NO MILK/CREAM OR CREAMERS, sugar ok)  Clear Tea (NO MILK/CREAM OR CREAMERS, sugar ok) regular and decaf                             Plain Jell-O (NO RED)                                           Fruit ices (not with fruit pulp, NO RED)                                     Popsicles (NO RED)                                                               Sports drinks like Gatorade (NO RED)                     The day of surgery:  Drink ONE (1) Pre-Surgery Clear Ensure by 0930  AM the morning of surgery. Drink in one sitting. Do not sip.  This drink was given to you during your hospital  pre-op appointment visit. Nothing else to drink after completing the   Pre-Surgery Clear Ensure .          If you have questions, please contact your surgeons office.   FOLLOW  ANY ADDITIONAL PRE OP INSTRUCTIONS YOU RECEIVED FROM YOUR SURGEON'S OFFICE!!!     Oral Hygiene is also important to reduce your risk of infection.                                    Remember - BRUSH YOUR TEETH THE MORNING  OF SURGERY WITH YOUR REGULAR TOOTHPASTE  DENTURES WILL BE REMOVED PRIOR TO SURGERY PLEASE DO NOT APPLY Poly grip OR ADHESIVES!!!   Do NOT smoke after Midnight   Stop all vitamins and herbal supplements 7 days before surgery.   Take these medicines the morning of surgery with A SIP OF WATER: omeprazole, methocarbamol  if needed, Divalproex , amlodipine , Carvedilol , doxycycline ?     Bring CPAP mask and tubing day of surgery.                              You may not have any metal on your body including hair pins, jewelry, and body piercing             Do not wear  lotions, powders,/cologne, or deodorant               Men may shave face and neck.   Do not bring valuables to the hospital. [facility] IS NOT             RESPONSIBLE   FOR VALUABLES.   Contacts, glasses, dentures or bridgework may not be worn into surgery.     DO NOT BRING YOUR HOME MEDICATIONS TO THE HOSPITAL. PHARMACY WILL DISPENSE MEDICATIONS LISTED ON YOUR MEDICATION LIST TO YOU DURING YOUR ADMISSION IN THE HOSPITAL!    Patients discharged on the day of surgery will not be allowed to drive home.  Someone NEEDS to stay with you for the first 24 hours after anesthesia.   Special Instructions: Bring a copy of your healthcare power of attorney and living will documents the day of surgery if you haven't scanned them before.              Please read over the following fact sheets you were given: IF YOU HAVE QUESTIONS ABOUT YOUR PRE-OP INSTRUCTIONS PLEASE CALL [phone].   If you test positive for Covid or have been in contact with anyone that has tested positive in the last 10 days  please notify you surgeon.      Pre-operative 4 CHG Bath Instructions  DYNA-Hex 4 Chlorhexidine  Gluconate 4% Solution Antiseptic 4 fl. oz   You can play a key role in reducing the risk of infection after surgery. Your skin needs to be as free of germs as possible. You can reduce the number of germs on your skin by washing with CHG (chlorhexidine  gluconate) soap before surgery. CHG is an antiseptic soap that kills germs and continues to kill germs even after washing.   DO NOT use if you have an allergy to chlorhexidine /CHG or antibacterial soaps. If your skin becomes reddened or irritated, stop using the CHG and notify one of our RNs at   Please shower with the CHG soap starting 4 days before surgery using the following schedule:     Please keep in mind the following:  DO NOT shave, including legs and underarms, starting the day of your first shower.   You may shave your face at any point before/day of surgery.  Place clean sheets on your bed the day you start using CHG soap. Use a clean washcloth (not used since being washed) for each shower. DO NOT sleep with pets once you start using the CHG.  CHG Shower Instructions:  If you choose to wash your hair and private area, wash first with your normal shampoo/soap.  After you use shampoo/soap, rinse your hair and body thoroughly to remove shampoo/soap residue.  Turn  the water OFF and apply about 3 tablespoons (45 ml) of CHG soap to a CLEAN washcloth.  Apply CHG soap ONLY FROM YOUR NECK DOWN TO YOUR TOES (washing for 3-5 minutes)  DO NOT use CHG soap on face, private areas, open wounds, or sores.  Pay special attention to the area where your surgery is being performed.  If you are having back surgery, having someone wash your back for you may be helpful. Wait 2 minutes after CHG soap is applied, then you may rinse off the CHG soap.  Pat dry with a clean towel  Put on clean clothes/pajamas   If you choose to wear lotion, please use ONLY  the CHG-compatible lotions on the back of this paper.     Additional instructions for the day of surgery: DO NOT APPLY any lotions, deodorants, cologne, or perfumes.   Put on clean/comfortable clothes.  Brush your teeth.  Ask your nurse before applying any prescription medications to the skin.   CHG Compatible Lotions   Aveeno Moisturizing lotion  Cetaphil Moisturizing Cream  Cetaphil Moisturizing Lotion  Clairol Herbal Essence Moisturizing Lotion, Dry Skin  Clairol Herbal Essence Moisturizing Lotion, Extra Dry Skin  Clairol Herbal Essence Moisturizing Lotion, Normal Skin  Curel Age Defying Therapeutic Moisturizing Lotion with Alpha Hydroxy  Curel Extreme Care Body Lotion  Curel Soothing Hands Moisturizing Hand Lotion  Curel Therapeutic Moisturizing Cream, Fragrance-Free  Curel Therapeutic Moisturizing Lotion, Fragrance-Free  Curel Therapeutic Moisturizing Lotion, Original Formula  Eucerin Daily Replenishing Lotion  Eucerin Dry Skin Therapy Plus Alpha Hydroxy Crme  Eucerin Dry Skin Therapy Plus Alpha Hydroxy Lotion  Eucerin Original Crme  Eucerin Original Lotion  Eucerin Plus Crme Eucerin Plus Lotion  Eucerin TriLipid Replenishing Lotion  Keri Anti-Bacterial Hand Lotion  Keri Deep Conditioning Original Lotion Dry Skin Formula Softly Scented  Keri Deep Conditioning Original Lotion, Fragrance Free Sensitive Skin Formula  Keri Lotion Fast Absorbing Fragrance Free Sensitive Skin Formula  Keri Lotion Fast Absorbing Softly Scented Dry Skin Formula  Keri Original Lotion  Keri Skin Renewal Lotion Keri Silky Smooth Lotion  Keri Silky Smooth Sensitive Skin Lotion  Nivea Body Creamy Conditioning Oil  Nivea Body Extra Enriched Lotion  Nivea Body Original Lotion  Nivea Body Sheer Moisturizing Lotion Nivea Crme  Nivea Skin Firming Lotion  NutraDerm 30 Skin Lotion  NutraDerm Skin Lotion  NutraDerm Therapeutic Skin Cream  NutraDerm Therapeutic Skin Lotion  ProShield Protective Hand  Cream   Incentive Spirometer  An incentive spirometer is a tool that can help keep your lungs clear and active. This tool measures how well you are filling your lungs with each breath. Taking long deep breaths may help reverse or decrease the chance of developing breathing (pulmonary) problems (especially infection) following: A long period of time when you are unable to move or be active. BEFORE THE PROCEDURE  If the spirometer includes an indicator to show your best effort, your nurse or respiratory therapist will set it to a desired goal. If possible, sit up straight or lean slightly forward. Try not to slouch. Hold the incentive spirometer in an upright position. INSTRUCTIONS FOR USE  Sit on the edge of your bed if possible, or sit up as far as you can in bed or on a chair. Hold the incentive spirometer in an upright position. Breathe out normally. Place the mouthpiece in your mouth and seal your lips tightly around it. Breathe in slowly and as deeply as possible, raising the piston or the ball toward the top  of the column. Hold your breath for 3-5 seconds or for as long as possible. Allow the piston or ball to fall to the bottom of the column. Remove the mouthpiece from your mouth and breathe out normally. Rest for a few seconds and repeat Steps 1 through 7 at least 10 times every 1-2 hours when you are awake. Take your time and take a few normal breaths between deep breaths. The spirometer may include an indicator to show your best effort. Use the indicator as a goal to work toward during each repetition. After each set of 10 deep breaths, practice coughing to be sure your lungs are clear. If you have an incision (the cut made at the time of surgery), support your incision when coughing by placing a pillow or rolled up towels firmly against it. Once you are able to get out of bed, walk around indoors and cough well. You may stop using the incentive spirometer when instructed by your  caregiver.  RISKS AND COMPLICATIONS Take your time so you do not get dizzy or light-headed. If you are in pain, you may need to take or ask for pain medication before doing incentive spirometry. It is harder to take a deep breath if you are having pain. AFTER USE Rest and breathe slowly and easily. It can be helpful to keep track of a log of your progress. Your caregiver can provide you with a simple table to help with this. If you are using the spirometer at home, follow these instructions: SEEK MEDICAL CARE IF:  You are having difficultly using the spirometer. You have trouble using the spirometer as often as instructed. Your pain medication is not giving enough relief while using the spirometer. You develop fever of 100.5 F (38.1 C) or higher. SEEK IMMEDIATE MEDICAL CARE IF:  You cough up bloody sputum that had not been present before. You develop fever of 102 F (38.9 C) or greater. You develop worsening pain at or near the incision site. MAKE SURE YOU:  Understand these instructions. Will watch your condition. Will get help right away if you are not doing well or get worse. Document Released: 09/04/2006 Document Revised: 07/17/2011 Document Reviewed: 11/05/2006 [employer] Patient Information 2014 [place], [place].   ________________________________________________________________________

## 2024-04-25 ENCOUNTER — Other Ambulatory Visit: Payer: Self-pay

## 2024-04-25 ENCOUNTER — Encounter (HOSPITAL_COMMUNITY): Payer: Self-pay

## 2024-04-25 ENCOUNTER — Encounter (HOSPITAL_COMMUNITY)
Admission: RE | Admit: 2024-04-25 | Discharge: 2024-04-25 | Disposition: A | Discharge status: Home / Self Care | Source: Ambulatory Visit | Attending: Orthopedic Surgery | Admitting: Orthopedic Surgery

## 2024-04-25 VITALS — BP 155/93 | HR 51 | Temp 98.6°F | Resp 16 | Ht 74.0 in | Wt 225.0 lb

## 2024-04-25 DIAGNOSIS — Z01818 Encounter for other preprocedural examination: Secondary | ICD-10-CM

## 2024-04-25 DIAGNOSIS — K219 Gastro-esophageal reflux disease without esophagitis: Secondary | ICD-10-CM | POA: Insufficient documentation

## 2024-04-25 DIAGNOSIS — Z87891 Personal history of nicotine dependence: Secondary | ICD-10-CM | POA: Insufficient documentation

## 2024-04-25 DIAGNOSIS — Z79899 Other long term (current) drug therapy: Secondary | ICD-10-CM | POA: Insufficient documentation

## 2024-04-25 DIAGNOSIS — K449 Diaphragmatic hernia without obstruction or gangrene: Secondary | ICD-10-CM | POA: Insufficient documentation

## 2024-04-25 DIAGNOSIS — Z01812 Encounter for preprocedural laboratory examination: Secondary | ICD-10-CM | POA: Diagnosis present

## 2024-04-25 DIAGNOSIS — I10 Essential (primary) hypertension: Secondary | ICD-10-CM | POA: Diagnosis not present

## 2024-04-25 DIAGNOSIS — I2510 Atherosclerotic heart disease of native coronary artery without angina pectoris: Secondary | ICD-10-CM | POA: Insufficient documentation

## 2024-04-25 DIAGNOSIS — M1711 Unilateral primary osteoarthritis, right knee: Secondary | ICD-10-CM | POA: Insufficient documentation

## 2024-04-25 DIAGNOSIS — G8929 Other chronic pain: Secondary | ICD-10-CM | POA: Diagnosis not present

## 2024-04-25 LAB — BASIC METABOLIC PANEL WITH GFR
Anion gap: 7 (ref 5–15)
BUN: 8 mg/dL (ref 8–23)
CO2: 28 mmol/L (ref 22–32)
Calcium: 9 mg/dL (ref 8.9–10.3)
Chloride: 97 mmol/L — ABNORMAL LOW (ref 98–111)
Creatinine, Ser: 0.63 mg/dL (ref 0.61–1.24)
GFR, Estimated: >60 mL/min
Glucose, Bld: 101 mg/dL — ABNORMAL HIGH (ref 70–99)
Potassium: 5.4 mmol/L — ABNORMAL HIGH (ref 3.5–5.1)
Sodium: 132 mmol/L — ABNORMAL LOW (ref 135–145)

## 2024-04-25 LAB — CBC
HCT: 42.7 % (ref 39.0–52.0)
Hemoglobin: 14.7 g/dL (ref 13.0–17.0)
MCH: 31.1 pg (ref 26.0–34.0)
MCHC: 34.4 g/dL (ref 30.0–36.0)
MCV: 90.3 fL (ref 80.0–100.0)
Platelets: 130 K/uL — ABNORMAL LOW (ref 150–400)
RBC: 4.73 MIL/uL (ref 4.22–5.81)
RDW: 14.7 % (ref 11.5–15.5)
WBC: 5.3 K/uL (ref 4.0–10.5)
nRBC: 0 % (ref 0.0–0.2)

## 2024-04-25 LAB — SURGICAL PCR SCREEN
MRSA, PCR: NEGATIVE
Staphylococcus aureus: NEGATIVE

## 2024-04-25 NOTE — Telephone Encounter (Signed)
 Preop tell appt now rescheduled

## 2024-04-28 ENCOUNTER — Encounter (HOSPITAL_COMMUNITY): Payer: Self-pay

## 2024-04-28 NOTE — Anesthesia Preprocedure Evaluation (Addendum)
"                                    Anesthesia Evaluation  Patient identified by MRN, date of birth, ID band Patient awake    Reviewed: Allergy & Precautions, NPO status , Patient's Chart, lab work & pertinent test results, reviewed documented beta blocker date and time   History of Anesthesia Complications Negative for: history of anesthetic complications  Airway Mallampati: III  TM Distance: >3 FB     Dental no notable dental hx.    Pulmonary neg COPD, former smoker   breath sounds clear to auscultation       Cardiovascular hypertension, + CAD  (-) Past MI, (-) Cardiac Stents, (-) CABG, (-) Peripheral Vascular Disease and (-) CHF  Rhythm:Regular Rate:Normal     Neuro/Psych  Headaches, neg Seizures PSYCHIATRIC DISORDERS  Depression       GI/Hepatic hiatal hernia,GERD  ,,(+) neg Cirrhosis        Endo/Other    Renal/GU Renal disease     Musculoskeletal  (+) Arthritis , Osteoarthritis,    Abdominal   Peds  Hematology  (+) Blood dyscrasia Mild thrombocytopenia   Anesthesia Other Findings   Reproductive/Obstetrics                              Anesthesia Physical Anesthesia Plan  ASA: 2  Anesthesia Plan: Spinal and MAC   Post-op Pain Management: Regional block*   Induction: Intravenous  PONV Risk Score and Plan: 1 and Ondansetron  and Propofol  infusion  Airway Management Planned: Natural Airway and Simple Face Mask  Additional Equipment:   Intra-op Plan:   Post-operative Plan:   Informed Consent: I have reviewed the patients History and Physical, chart, labs and discussed the procedure including the risks, benefits and alternatives for the proposed anesthesia with the patient or authorized representative who has indicated his/her understanding and acceptance.     Dental advisory given  Plan Discussed with: CRNA  Anesthesia Plan Comments: (See PAT note from 12/19 )         Anesthesia Quick  Evaluation  "

## 2024-04-28 NOTE — H&P (Signed)
 KNEE ARTHROPLASTY ADMISSION H&P  Patient ID: Daniel Villanueva MRN: [id number] DOB/AGE: Oct 04, 1960 63 y.o.  Chief Complaint: right knee pain.  Planned Procedure Date: 05/06/24 Medical and Pain Management Clearance by Dr. Pat Ruth   Cardiac Clearance by Delon Hoover NP   HPI: Daniel Villanueva is a 63 y.o. male who presents for evaluation of OA RIGHT KNEE. The patient has a history of pain and functional disability in the right knee due to arthritis and has failed non-surgical conservative treatments for greater than 12 weeks to include NSAID's and/or analgesics, corticosteriod injections, viscosupplementation injections, use of assistive devices, and activity modification.  Onset of symptoms was abrupt, starting 7 months ago with rapidlly worsening course since that time. The patient noted prior procedures on the knee to include  arthroscopy and menisectomy on the right knee.  Patient currently rates pain at 8 out of 10 with activity. Patient has night pain, worsening of pain with activity and weight bearing, and pain that interferes with activities of daily living.  Patient has evidence of subchondral sclerosis, periarticular osteophytes, and joint space narrowing by imaging studies.  There is no active infection.  Past Medical History:  Diagnosis Date   Aneurysm    aortic   Arthritis    back, neck &hands    Coronary artery calcification    GERD (gastroesophageal reflux disease)    Headache    controlled on Depakote     Vestibular migraines   High cholesterol    History of hiatal hernia    Hypertension    Spondylolisthesis of lumbar region    Past Surgical History:  Procedure Laterality Date   BACK SURGERY     EYE SURGERY     bot eyes- cataracts removed- /w IOL   HEMORROIDECTOMY  1988   HERNIA REPAIR  1998   umbilical hernia    KNEE ARTHROSCOPY WITH MEDIAL MENISECTOMY Right 11/01/2023   Procedure: ARTHROSCOPY, KNEE, WITH MEDIAL MENISCECTOMY;  Surgeon: Cristy Bonner DASEN, MD;  Location:   [facility]ERY [facility];  Service: Orthopedics;  Laterality: Right;   NECK SURGERY     TONSILLECTOMY     Allergies[1] Prior to Admission medications  Medication Sig Start Date End Date Taking? Authorizing Provider  amLODipine  (NORVASC ) 10 MG tablet Take 5 mg by mouth daily. 08/11/23  Yes [provider]  aspirin  EC 81 MG tablet Take 81 mg by mouth daily. Swallow whole.   Yes [provider]  atorvastatin  (LIPITOR) 40 MG tablet TAKE 1 TABLET BY MOUTH AT BEDTIME 04/07/24  Yes Tolia, Sunit, DO  Buprenorphine HCl-Naloxone HCl 8-2 MG FILM Place 1 Film under the tongue 2 (two) times daily. 03/27/24  Yes [provider]  carvedilol  (COREG ) 12.5 MG tablet Take 12.5 mg by mouth 2 (two) times daily with a meal.   Yes [provider]  divalproex  (DEPAKOTE ) 250 MG DR tablet Take 500 mg by mouth daily.   Yes [provider]  losartan  (COZAAR ) 100 MG tablet TAKE 1 TABLET BY MOUTH EVERY EVENING 04/07/24  Yes Tolia, Sunit, DO  omeprazole (PRILOSEC) 20 MG capsule Take 20 mg by mouth every morning.   Yes [provider]  psyllium (METAMUCIL) 58.6 % powder Take 1 packet by mouth every evening.   Yes [provider]  spironolactone  (ALDACTONE ) 25 MG tablet TAKE 1 TABLET BY MOUTH EVERY MORNING 03/10/24  Yes Tolia, Sunit, DO  tadalafil (CIALIS) 20 MG tablet Take 20 mg by mouth daily as needed for erectile dysfunction. 03/13/24  Yes [provider]  testosterone cypionate (DEPOTESTOTERONE CYPIONATE) 100 MG/ML injection Inject 100 mg into the muscle every 14 (fourteen) days.   Yes [provider]   Social History   Socioeconomic History   Marital status: Married    Spouse name: Andree   Number of children: 3   Years of education: Not on file   Highest education level: Not on file  Occupational History   Not on file  Tobacco Use   Smoking status: Former    Current packs/day: 0.00    Types: Cigarettes    Start date: 05/02/1978     Quit date: 05/02/1988    Years since quitting: 36.0   Smokeless tobacco: Never  Vaping Use   Vaping status: Never Used  Substance and Sexual Activity   Alcohol use: Not Currently    Comment: occasional   Drug use: No   Sexual activity: Not Currently  Other Topics Concern   Not on file  Social History Narrative   Not on file   Social Drivers of Health   Tobacco Use: Medium Risk (04/28/2024)   Patient History    Smoking Tobacco Use: Former    Smokeless Tobacco Use: Never    Passive Exposure: Not on file  Financial Resource Strain: Low Risk (06/01/2022)   Received from [facility]   Overall Financial Resource Strain (CARDIA)    Difficulty of Paying Living Expenses: Not hard at all  Food Insecurity: No Food Insecurity (06/01/2022)   Received from [facility]   Epic    Within the past 12 months, you worried that your food would run out before you got the money to buy more.: Never true    Within the past 12 months, the food you bought just didn't last and you didn't have money to get more.: Never true  Transportation Needs: No Transportation Needs (06/01/2022)   Received from [facility] - Transportation    Lack of Transportation (Medical): No    Lack of Transportation (Non-Medical): No  Physical Activity: Insufficiently Active (06/01/2022)   Received from [facility]   Exercise Vital Sign    On average, how many days per week do you engage in moderate to strenuous exercise (like a brisk walk)?: 2 days    On average, how many minutes do you engage in exercise at this level?: 20 min  Stress: No Stress Concern Present (06/01/2022)   Received from [facility] - Occupational Stress Questionnaire    Feeling of Stress : Not at all  Social Connections: Socially Integrated (06/01/2022)   Received from [facility]   Social Network    How would you rate your social network (family, work, friends)?: Good participation with  social networks  Depression (PHQ2-9): Not on file  Alcohol Screen: Not on file  Housing: Not on file  Utilities: Not At Risk (06/01/2022)   Received from [facility] Utilities    Threatened with loss of utilities: No  Health Literacy: Not on file   Family History  Problem Relation Age of Onset   Heart disease Mother    Heart disease Father     ROS: Currently denies lightheadedness, dizziness, Fever, chills, CP, SOB.   No personal history of DVT, PE, MI, or CVA. No loose teeth or dentures All other systems have been reviewed and were otherwise currently negative with the exception of those mentioned in the HPI and as above.  Objective: Vitals: Ht: 6'2 Wt:  220 lbs Temp: 98.4 BP: 141/73 Pulse: 56 O2 97% on room air.   Physical Exam: General: Alert, NAD.  Antalgic Gait  HEENT: EOMI, Good Neck Extension  Pulm: No increased work of breathing.  Clear B/L A/P w/o crackle or wheeze.  CV: Bradycardic rate with RR, No m/g/r appreciated  GI: soft, NT, ND. BS x 4 quadrants Neuro: CN II-XII grossly intact without focal deficit.  Sensation intact distally Skin: No lesions in the area of chief complaint MSK/Surgical Site:  + JLT. ROM slow from 10-95 degrees.  4/5 strength in extension and flexion.  +EHL/FHL.  NVI.  Pain and instability with varus and valgus stress.    Imaging Review Plain radiographs demonstrate moderate degenerative joint disease of the right knee.   The overall alignment ismild varus. The bone quality appears to be fair for age and reported activity level.  Preoperative templating of the joint replacement has been completed, documented, and submitted to the Operating Room personnel in order to optimize intra-operative equipment management.  Assessment: OA RIGHT KNEE Active Problems:   * No active hospital problems. *   Plan: Plan for Procedures: ARTHROPLASTY, KNEE, TOTAL  The patient history, physical exam, clinical judgement of the provider and imaging  are consistent with end stage degenerative joint disease and total joint arthroplasty is deemed medically necessary. The treatment options including medical management, injection therapy, and arthroplasty were discussed at length. The risks and benefits of Procedures: ARTHROPLASTY, KNEE, TOTAL were presented and reviewed.  The risks of nonoperative treatment, versus surgical intervention including but not limited to continued pain, aseptic loosening, stiffness, dislocation/subluxation, infection, bleeding, nerve injury, blood clots, cardiopulmonary complications, morbidity, mortality, among others were discussed. The patient verbalizes understanding and wishes to proceed with the plan.  Patient is being admitted for inpatient treatment for surgery, pain control, PT, prophylactic antibiotics, VTE prophylaxis, progressive ambulation, ADL's and discharge planning.   Dental prophylaxis discussed and recommended for 2 years postoperatively.  The patient does meet the criteria for TXA which will be used perioperatively.   ASA 81 mg BID will be used postoperatively for DVT prophylaxis in addition to SCDs, and early ambulation.  Plan for patient to continue to take his Suboxone for chronic pain and we will supply in addition to that Oxycodone , Tylenol , and Celebrex for acute pain control Robaxin  for muscle spasms.   Zofran  for nausea and vomiting. Pharmacy- [facility] The patient is planning to be discharged home with OPPT and into the care of his wife Andree who can be reached at [phone] Follow up appt 05/21/24 at [facility] CHRISTELLA Ted [place] Office [phone] 04/28/2024 5:15 PM        [1]  Allergies Allergen Reactions   Adhesive [Tape] Dermatitis    Skin irritation from clear Tegaderm bandage only. Other adhesive fine

## 2024-04-28 NOTE — Progress Notes (Signed)
 " Case: [id number] Date/Time: 05/06/24 1215   Procedure: ARTHROPLASTY, KNEE, TOTAL (Right: Knee)   Anesthesia type: Spinal   Pre-op diagnosis: OA RIGHT KNEE   Location: [facility] ROOM 07 / [facility]   Surgeons: Beverley Evalene BIRCH, MD       DISCUSSION: Daniel Villanueva is a 63 yo male with PMH of former smoking, HTN, CAD (by imaging), TAA/dilated aortic root, headaches, GERD, hiatal hernia, arthritis, chronic pain on suboxone, hx of lumbar fusion L4-5 (2019), hx of ACDF C6-7 (2019)  Patient follows with Cardiology for coronary calcifications and TAA (4.0cm). Last seen on 08/28/23 by Dr. Michele. He denied any symptoms and BP was controlled. He was advised to f/u in 1 year. He had cardiac clearance tele visit on 04/02/24 and was cleared:  Preoperative Cardiovascular Risk Assessment:    Daniel Villanueva perioperative risk of a major cardiac event is 0.4% according to the Revised Cardiac Risk Index (RCRI).  Therefore, he is at low risk for perioperative complications.   His functional capacity is good at 5.38 METs according to the Duke Activity Status Index (DASI). Recommendations: According to ACC/AHA guidelines, no further cardiovascular testing needed.  The patient may proceed to surgery at acceptable risk.    VS: BP (!) 155/93   Pulse (!) 51   Temp 37 C (Oral)   Resp 16   Ht 6' 2 (1.88 m)   Wt 102.1 kg   SpO2 99%   BMI 28.89 kg/m   PROVIDERS: Jama Chow, MD   LABS: Labs reviewed: Acceptable for surgery. (all labs ordered are listed, but only abnormal results are displayed)  Labs Reviewed  BASIC METABOLIC PANEL WITH GFR - Abnormal; Notable for the following components:      Result Value   Sodium 132 (*)    Potassium 5.4 (*)    Chloride 97 (*)    Glucose, Bld 101 (*)    All other components within normal limits  CBC - Abnormal; Notable for the following components:   Platelets 130 (*)    All other components within normal limits  SURGICAL PCR SCREEN     CT ANGIO CHEST AORTA W/CM & OR  WO/CM 08/24/2023  1. Stable dilated tubular ascending thoracic aorta measuring 4.0 cm. 2. Recommend annual imaging followup by CTA or MRA. This recommendation follows 2010 ACCF/AHA/AATS/ACR/ASA/SCA/SCAI/SIR/STS/SVM Guidelines for the Diagnosis and Management of Patients with Thoracic Aortic Disease. Circulation. 2010; 121: Z733-z630. Aortic aneurysm NOS (ICD10-I71.9)    EKG 08/28/23:  Sinus rhythm with first-degree AV block Nonspecific IVCD   Echocardiogram 01/21/24:  IMPRESSIONS    1. Left ventricular ejection fraction, by estimation, is 60 to 65%. The left ventricle has normal function. The left ventricle has no regional wall motion abnormalities. Left ventricular diastolic parameters are indeterminate. The average left ventricular global longitudinal strain is -25.3 %. The global longitudinal strain is normal.  2. Right ventricular systolic function is normal. The right ventricular size is normal.  3. Left atrial size was mild to moderately dilated.  4. Right atrial size was mildly dilated.  5. The mitral valve is normal in structure. Trivial mitral valve regurgitation. No evidence of mitral stenosis.  6. The aortic valve is tricuspid. Aortic valve regurgitation is not visualized. No aortic stenosis is present.  7. Aortic dilatation noted. There is mild dilatation of the aortic root, measuring 45 mm. There is borderline dilatation of the ascending aorta, measuring 38 mm.  8. The inferior vena cava is normal in size with greater than 50% respiratory variability,  suggesting right atrial pressure of 3 mmHg.  Comparison(s): Prior images reviewed side by side.  Conclusion(s)/Recommendation(s): Aortic root measures 45 mm (previously 47 mm), ascending aorta measures 38 mm (previously 40 mm). This is within the range of variation, suspect dimensions are unchanged.   Stress test 12/01/2019: Non-diagnostic ECG stress. due to pharmacologic stress. There is mild diaphragmatic  attenuation in the inferior wall.  Myocardial perfusion is normal. Overall LV systolic function is mildly depressed without regional wall motion abnormalities. Stress LV EF: 47%.  Visually LVEF appears normal.  No previous exam available for comparison. Low risk.   Past Medical History:  Diagnosis Date   Aneurysm    aortic   Arthritis    back, neck &hands    Coronary artery calcification    GERD (gastroesophageal reflux disease)    Headache    controlled on Depakote     Vestibular migraines   High cholesterol    History of hiatal hernia    Hypertension    Spondylolisthesis of lumbar region     Past Surgical History:  Procedure Laterality Date   BACK SURGERY     EYE SURGERY     bot eyes- cataracts removed- /w IOL   HEMORROIDECTOMY  1988   HERNIA REPAIR  1998   umbilical hernia    KNEE ARTHROSCOPY WITH MEDIAL MENISECTOMY Right 11/01/2023   Procedure: ARTHROSCOPY, KNEE, WITH MEDIAL MENISCECTOMY;  Surgeon: Cristy Bonner DASEN, MD;  Location: [facility];  Service: Orthopedics;  Laterality: Right;   NECK SURGERY     TONSILLECTOMY      MEDICATIONS:  amLODipine  (NORVASC ) 10 MG tablet   aspirin  EC 81 MG tablet   atorvastatin  (LIPITOR) 40 MG tablet   Buprenorphine HCl-Naloxone HCl 8-2 MG FILM   carvedilol  (COREG ) 12.5 MG tablet   divalproex  (DEPAKOTE ) 250 MG DR tablet   losartan  (COZAAR ) 100 MG tablet   omeprazole (PRILOSEC) 20 MG capsule   psyllium (METAMUCIL) 58.6 % powder   spironolactone  (ALDACTONE ) 25 MG tablet   tadalafil (CIALIS) 20 MG tablet   testosterone cypionate (DEPOTESTOTERONE CYPIONATE) 100 MG/ML injection   No current facility-administered medications for this encounter.   Daniel Villanueva [phone] 04/28/2024 9:38 AM        "

## 2024-05-19 NOTE — Progress Notes (Addendum)
 Date of COVID positive in last 90 days:  PCP - Daniel Ruth, MD Cardiologist - Madonna Large, DO  Cardiac clearance by Margery Hoover, NP 04/02/24  CT chest- 08/24/23 Epic Chest x-ray - N/A EKG - 08/28/23 Epic Stress Test - 12/01/19 Epic ECHO - 01/21/24 Epic Cardiac Cath - N/A Pacemaker/ICD device last checked:N/A Spinal Cord Stimulator:N/A  Bowel Prep - N/A  Sleep Study - N/A CPAP -   Fasting Blood Sugar - N/A Checks Blood Sugar _____ times a day  Last dose of GLP1 agonist-  N/A GLP1 instructions:  Do not take after     Last dose of SGLT-2 inhibitors-  N/A SGLT-2 instructions:  Do not take after     Blood Thinner Instructions: N/A Last dose:   Time: Aspirin  Instructions: ASA 81, hold 7 days Last Dose:  Activity level: Can go up a flight of stairs and perform activities of daily living without stopping and without symptoms of chest pain or shortness of breath.  Anesthesia review: AAA, HTN, 1st degree AV block  Patient denies shortness of breath, fever, cough and chest pain at Daniel appointment  Patient verbalized understanding of instructions that were given to them at the Daniel appointment. Patient was also instructed that they will need to review over the Daniel instructions again at home before surgery.

## 2024-05-19 NOTE — Patient Instructions (Signed)
 SURGICAL WAITING ROOM VISITATION  Patients having surgery or a procedure may have no more than 2 support people in the waiting area - these visitors may rotate.    Children ages 21 and under will not be able to visit patients in [facility] under most circumstances.   Visitors with respiratory illnesses are discouraged from visiting and should remain at home.  If the patient needs to stay at the hospital during part of their recovery, the visitor guidelines for inpatient rooms apply. Pre-op nurse will coordinate an appropriate time for 1 support person to accompany patient in pre-op.  This support person may not rotate.    Please refer to the [facility] website for the visitor guidelines for Inpatients (after your surgery is over and you are in a regular room).    Your procedure is scheduled on: 05/27/24   Report to [facility] Main Entrance    Report to admitting at 7:25 AM   Call this number if you have problems the morning of surgery [phone]   Do not eat food :After Midnight.   After Midnight you may have the following liquids until 6:55 AM DAY OF SURGERY  Water Non-Citrus Juices (without pulp, NO RED-Apple, White grape, White cranberry) Black Coffee (NO MILK/CREAM OR CREAMERS, sugar ok)  Clear Tea (NO MILK/CREAM OR CREAMERS, sugar ok) regular and decaf                             Plain Jell-O (NO RED)                                           Fruit ices (not with fruit pulp, NO RED)                                     Popsicles (NO RED)                                                               Sports drinks like Gatorade (NO RED)               The day of surgery:  Drink ONE (1) Pre-Surgery Clear Ensure at 6:55 AM the morning of surgery. Drink in one sitting. Do not sip.  This drink was given to you during your hospital  pre-op appointment visit. Nothing else to drink after completing the  Pre-Surgery Clear Ensure.          If you have questions,  please contact your surgeons office.   FOLLOW BOWEL PREP AND ANY ADDITIONAL PRE OP INSTRUCTIONS YOU RECEIVED FROM YOUR SURGEON'S OFFICE!!!     Oral Hygiene is also important to reduce your risk of infection.                                    Remember - BRUSH YOUR TEETH THE MORNING OF SURGERY WITH YOUR REGULAR TOOTHPASTE  DENTURES WILL BE REMOVED PRIOR TO SURGERY PLEASE DO NOT APPLY Poly grip  OR ADHESIVES!!!   Stop all vitamins and herbal supplements 7 days before surgery.   Take these medicines the morning of surgery with A SIP OF WATER: Amlodipine , Carvedilol , Omeprazole                               You may not have any metal on your body including hair pins, jewelry, and body piercing             Do not wear lotions, powders, cologne, or deodorant              Men may shave face and neck.   Do not bring valuables to the hospital. [facility] IS NOT             RESPONSIBLE   FOR VALUABLES.   Contacts, glasses, dentures or bridgework may not be worn into surgery.  DO NOT BRING YOUR HOME MEDICATIONS TO THE HOSPITAL. PHARMACY WILL DISPENSE MEDICATIONS LISTED ON YOUR MEDICATION LIST TO YOU DURING YOUR ADMISSION IN THE HOSPITAL!    Patients discharged on the day of surgery will not be allowed to drive home.  Someone NEEDS to stay with you for the first 24 hours after anesthesia.   Special Instructions: Bring a copy of your healthcare power of attorney and living will documents the day of surgery if you haven't scanned them before.              Please read over the following fact sheets you were given: IF YOU HAVE QUESTIONS ABOUT YOUR PRE-OP INSTRUCTIONS PLEASE CALL [phone] [place].   If you received a COVID test during your pre-op visit  it is requested that you wear a mask when out in public, stay away from anyone that may not be feeling well and notify your surgeon if you develop symptoms. If you test positive for Covid or have been in contact with anyone that has tested  positive in the last 10 days please notify you surgeon.      Pre-operative 4 CHG Bath Instructions  DYNA-Hex 4 Chlorhexidine  Gluconate 4% Solution Antiseptic 4 fl. oz   You can play a key role in reducing the risk of infection after surgery. Your skin needs to be as free of germs as possible. You can reduce the number of germs on your skin by washing with CHG (chlorhexidine  gluconate) soap before surgery. CHG is an antiseptic soap that kills germs and continues to kill germs even after washing.   DO NOT use if you have an allergy to chlorhexidine /CHG or antibacterial soaps. If your skin becomes reddened or irritated, stop using the CHG and notify one of our RNs at   Please shower with the CHG soap starting 4 days before surgery using the following schedule:     Please keep in mind the following:  DO NOT shave, including legs and underarms, starting the day of your first shower.   You may shave your face at any point before/day of surgery.  Place clean sheets on your bed the day you start using CHG soap. Use a clean washcloth (not used since being washed) for each shower. DO NOT sleep with pets once you start using the CHG.  CHG Shower Instructions:  If you choose to wash your hair and private area, wash first with your normal shampoo/soap.  After you use shampoo/soap, rinse your hair and body thoroughly to remove shampoo/soap residue.  Turn the water OFF and apply  about 3 tablespoons (45 ml) of CHG soap to a CLEAN washcloth.  Apply CHG soap ONLY FROM YOUR NECK DOWN TO YOUR TOES (washing for 3-5 minutes)  DO NOT use CHG soap on face, private areas, open wounds, or sores.  Pay special attention to the area where your surgery is being performed.  If you are having back surgery, having someone wash your back for you may be helpful. Wait 2 minutes after CHG soap is applied, then you may rinse off the CHG soap.  Pat dry with a clean towel  Put on clean clothes/pajamas   If you choose to  wear lotion, please use ONLY the CHG-compatible lotions on the back of this paper.     Additional instructions for the day of surgery: DO NOT APPLY any lotions, deodorants, cologne, or perfumes.   Put on clean/comfortable clothes.  Brush your teeth.  Ask your nurse before applying any prescription medications to the skin.   CHG Compatible Lotions   Aveeno Moisturizing lotion  Cetaphil Moisturizing Cream  Cetaphil Moisturizing Lotion  Clairol Herbal Essence Moisturizing Lotion, Dry Skin  Clairol Herbal Essence Moisturizing Lotion, Extra Dry Skin  Clairol Herbal Essence Moisturizing Lotion, Normal Skin  Curel Age Defying Therapeutic Moisturizing Lotion with Alpha Hydroxy  Curel Extreme Care Body Lotion  Curel Soothing Hands Moisturizing Hand Lotion  Curel Therapeutic Moisturizing Cream, Fragrance-Free  Curel Therapeutic Moisturizing Lotion, Fragrance-Free  Curel Therapeutic Moisturizing Lotion, Original Formula  Eucerin Daily Replenishing Lotion  Eucerin Dry Skin Therapy Plus Alpha Hydroxy Crme  Eucerin Dry Skin Therapy Plus Alpha Hydroxy Lotion  Eucerin Original Crme  Eucerin Original Lotion  Eucerin Plus Crme Eucerin Plus Lotion  Eucerin TriLipid Replenishing Lotion  Keri Anti-Bacterial Hand Lotion  Keri Deep Conditioning Original Lotion Dry Skin Formula Softly Scented  Keri Deep Conditioning Original Lotion, Fragrance Free Sensitive Skin Formula  Keri Lotion Fast Absorbing Fragrance Free Sensitive Skin Formula  Keri Lotion Fast Absorbing Softly Scented Dry Skin Formula  Keri Original Lotion  Keri Skin Renewal Lotion Keri Silky Smooth Lotion  Keri Silky Smooth Sensitive Skin Lotion  Nivea Body Creamy Conditioning Oil  Nivea Body Extra Enriched Teacher, Adult Education Moisturizing Lotion Nivea Crme  Nivea Skin Firming Lotion  NutraDerm 30 Skin Lotion  NutraDerm Skin Lotion  NutraDerm Therapeutic Skin Cream  NutraDerm Therapeutic Skin  Lotion  ProShield Protective Hand Cream  Provon moisturizing lotion

## 2024-05-20 ENCOUNTER — Other Ambulatory Visit: Payer: Self-pay

## 2024-05-20 ENCOUNTER — Encounter (HOSPITAL_COMMUNITY)
Admission: RE | Admit: 2024-05-20 | Discharge: 2024-05-20 | Disposition: A | Source: Ambulatory Visit | Attending: Orthopedic Surgery

## 2024-05-20 ENCOUNTER — Encounter (HOSPITAL_COMMUNITY): Payer: Self-pay

## 2024-05-20 VITALS — BP 129/85 | HR 56 | Temp 97.7°F | Resp 14 | Ht 74.0 in | Wt 225.0 lb

## 2024-05-20 DIAGNOSIS — Z01812 Encounter for preprocedural laboratory examination: Secondary | ICD-10-CM | POA: Insufficient documentation

## 2024-05-20 DIAGNOSIS — Z01818 Encounter for other preprocedural examination: Secondary | ICD-10-CM | POA: Diagnosis present

## 2024-05-20 LAB — SURGICAL PCR SCREEN
MRSA, PCR: NEGATIVE
Staphylococcus aureus: NEGATIVE

## 2024-05-27 ENCOUNTER — Other Ambulatory Visit: Payer: Self-pay

## 2024-05-27 ENCOUNTER — Encounter (HOSPITAL_COMMUNITY): Payer: Self-pay | Admitting: Orthopedic Surgery

## 2024-05-27 ENCOUNTER — Ambulatory Visit (HOSPITAL_COMMUNITY): Payer: Self-pay | Admitting: Medical

## 2024-05-27 ENCOUNTER — Ambulatory Visit (HOSPITAL_COMMUNITY)
Admission: RE | Admit: 2024-05-27 | Discharge: 2024-05-27 | Disposition: A | Discharge status: Home / Self Care | Source: Ambulatory Visit | Attending: Orthopedic Surgery | Admitting: Orthopedic Surgery

## 2024-05-27 ENCOUNTER — Encounter (HOSPITAL_COMMUNITY)
Admission: RE | Disposition: A | Discharge status: Home / Self Care | Payer: Self-pay | Source: Ambulatory Visit | Attending: Orthopedic Surgery

## 2024-05-27 ENCOUNTER — Ambulatory Visit (HOSPITAL_COMMUNITY): Payer: Self-pay | Admitting: Anesthesiology

## 2024-05-27 DIAGNOSIS — I2510 Atherosclerotic heart disease of native coronary artery without angina pectoris: Secondary | ICD-10-CM | POA: Insufficient documentation

## 2024-05-27 DIAGNOSIS — Z87891 Personal history of nicotine dependence: Secondary | ICD-10-CM | POA: Diagnosis not present

## 2024-05-27 DIAGNOSIS — K449 Diaphragmatic hernia without obstruction or gangrene: Secondary | ICD-10-CM | POA: Insufficient documentation

## 2024-05-27 DIAGNOSIS — Z8249 Family history of ischemic heart disease and other diseases of the circulatory system: Secondary | ICD-10-CM | POA: Insufficient documentation

## 2024-05-27 DIAGNOSIS — F32A Depression, unspecified: Secondary | ICD-10-CM | POA: Insufficient documentation

## 2024-05-27 DIAGNOSIS — M1711 Unilateral primary osteoarthritis, right knee: Secondary | ICD-10-CM

## 2024-05-27 DIAGNOSIS — D696 Thrombocytopenia, unspecified: Secondary | ICD-10-CM | POA: Diagnosis not present

## 2024-05-27 DIAGNOSIS — K219 Gastro-esophageal reflux disease without esophagitis: Secondary | ICD-10-CM | POA: Insufficient documentation

## 2024-05-27 DIAGNOSIS — I10 Essential (primary) hypertension: Secondary | ICD-10-CM | POA: Insufficient documentation

## 2024-05-27 HISTORY — PX: TOTAL KNEE ARTHROPLASTY: SHX125

## 2024-05-27 MED ORDER — ONDANSETRON HCL 4 MG/2ML IJ SOLN
INTRAMUSCULAR | Status: DC | PRN
  Administered 2024-05-27: 4 mg via INTRAVENOUS

## 2024-05-27 MED ORDER — OXYCODONE HCL 5 MG PO TABS: 5.0000 mg | ORAL_TABLET | ORAL | Status: DC | PRN

## 2024-05-27 MED ORDER — POVIDONE-IODINE 10 % EX SWAB: 2.0000 | Freq: Once | CUTANEOUS | Status: DC

## 2024-05-27 MED ORDER — FENTANYL CITRATE (PF) 100 MCG/2ML IJ SOLN
INTRAMUSCULAR | Status: AC
  Filled 2024-05-27: qty 2

## 2024-05-27 MED ORDER — FENTANYL CITRATE (PF) 100 MCG/2ML IJ SOLN
INTRAMUSCULAR | Status: DC | PRN
  Administered 2024-05-27: 100 ug via INTRAVENOUS

## 2024-05-27 MED ORDER — FENTANYL CITRATE (PF) 50 MCG/ML IJ SOSY
PREFILLED_SYRINGE | INTRAMUSCULAR | Status: AC
  Filled 2024-05-27: qty 1

## 2024-05-27 MED ORDER — HYDROMORPHONE HCL 1 MG/ML IJ SOLN
0.5000 mg | INTRAMUSCULAR | Status: DC | PRN
  Administered 2024-05-27: 1 mg via INTRAVENOUS

## 2024-05-27 MED ORDER — ROPIVACAINE HCL 5 MG/ML IJ SOLN
INTRAMUSCULAR | Status: DC | PRN
  Administered 2024-05-27: 15 mL via PERINEURAL

## 2024-05-27 MED ORDER — FENTANYL CITRATE (PF) 50 MCG/ML IJ SOSY: 50.0000 ug | PREFILLED_SYRINGE | Freq: Once | INTRAMUSCULAR | Status: DC

## 2024-05-27 MED ORDER — OXYCODONE HCL 5 MG/5ML PO SOLN: 5.0000 mg | Freq: Once | ORAL | Status: AC | PRN

## 2024-05-27 MED ORDER — SODIUM CHLORIDE 0.9 % IR SOLN
Status: DC | PRN
  Administered 2024-05-27: 1000 mL

## 2024-05-27 MED ORDER — TRANEXAMIC ACID-NACL 1000-0.7 MG/100ML-% IV SOLN
1000.0000 mg | INTRAVENOUS | Status: AC
  Administered 2024-05-27: 1000 mg via INTRAVENOUS
  Filled 2024-05-27: qty 100

## 2024-05-27 MED ORDER — ONDANSETRON HCL 4 MG/2ML IJ SOLN: 4.0000 mg | Freq: Once | INTRAMUSCULAR | Status: DC | PRN

## 2024-05-27 MED ORDER — OXYCODONE HCL 5 MG PO TABS
10.0000 mg | ORAL_TABLET | ORAL | Status: DC | PRN
  Administered 2024-05-27 (×2): 15 mg via ORAL

## 2024-05-27 MED ORDER — BUPIVACAINE LIPOSOME 1.3 % IJ SUSP: 20.0000 mL | Freq: Once | INTRAMUSCULAR | Status: DC

## 2024-05-27 MED ORDER — ACETAMINOPHEN 325 MG PO TABS: 325.0000 mg | ORAL_TABLET | Freq: Four times a day (QID) | ORAL | Status: DC | PRN

## 2024-05-27 MED ORDER — LACTATED RINGERS IV SOLN: INTRAVENOUS | Status: DC

## 2024-05-27 MED ORDER — DEXAMETHASONE SOD PHOSPHATE PF 10 MG/ML IJ SOLN: 8.0000 mg | Freq: Once | INTRAMUSCULAR | Status: DC

## 2024-05-27 MED ORDER — LACTATED RINGERS IV BOLUS: 500.0000 mL | Freq: Once | INTRAVENOUS | Status: DC

## 2024-05-27 MED ORDER — PHENYLEPHRINE HCL-NACL 20-0.9 MG/250ML-% IV SOLN
INTRAVENOUS | Status: DC | PRN
  Administered 2024-05-27: 25 ug/min via INTRAVENOUS

## 2024-05-27 MED ORDER — HYDROMORPHONE HCL 1 MG/ML IJ SOLN
INTRAMUSCULAR | Status: AC
  Filled 2024-05-27: qty 1

## 2024-05-27 MED ORDER — ONDANSETRON HCL 4 MG/2ML IJ SOLN: 4.0000 mg | Freq: Four times a day (QID) | INTRAMUSCULAR | Status: DC | PRN

## 2024-05-27 MED ORDER — ORAL CARE MOUTH RINSE: 15.0000 mL | Freq: Once | OROMUCOSAL | Status: AC

## 2024-05-27 MED ORDER — GLYCOPYRROLATE 0.2 MG/ML IJ SOLN
INTRAMUSCULAR | Status: DC | PRN
  Administered 2024-05-27: .2 mg via INTRAVENOUS

## 2024-05-27 MED ORDER — LACTATED RINGERS IV SOLN: INTRAVENOUS | Status: DC | PRN

## 2024-05-27 MED ORDER — CHLORHEXIDINE GLUCONATE 0.12 % MT SOLN
15.0000 mL | Freq: Once | OROMUCOSAL | Status: AC
  Administered 2024-05-27: 15 mL via OROMUCOSAL

## 2024-05-27 MED ORDER — BUPIVACAINE IN DEXTROSE 0.75-8.25 % IT SOLN
INTRATHECAL | Status: DC | PRN
  Administered 2024-05-27: 1.8 mL via INTRATHECAL

## 2024-05-27 MED ORDER — PROPOFOL 500 MG/50ML IV EMUL
INTRAVENOUS | Status: DC | PRN
  Administered 2024-05-27: 150 ug/kg/min via INTRAVENOUS

## 2024-05-27 MED ORDER — OXYCODONE HCL 5 MG PO TABS
ORAL_TABLET | ORAL | Status: AC
  Filled 2024-05-27: qty 3

## 2024-05-27 MED ORDER — BUPIVACAINE-EPINEPHRINE (PF) 0.25% -1:200000 IJ SOLN
INTRAMUSCULAR | Status: AC
  Filled 2024-05-27: qty 30

## 2024-05-27 MED ORDER — ONDANSETRON HCL 4 MG PO TABS: 4.0000 mg | ORAL_TABLET | Freq: Four times a day (QID) | ORAL | Status: DC | PRN

## 2024-05-27 MED ORDER — ACETAMINOPHEN 500 MG PO TABS: 1000.0000 mg | ORAL_TABLET | Freq: Four times a day (QID) | ORAL | Status: DC

## 2024-05-27 MED ORDER — SODIUM CHLORIDE (PF) 0.9 % IJ SOLN
INTRAMUSCULAR | Status: DC | PRN
  Administered 2024-05-27: 80 mL

## 2024-05-27 MED ORDER — ACETAMINOPHEN 500 MG PO TABS
1000.0000 mg | ORAL_TABLET | Freq: Once | ORAL | Status: AC
  Administered 2024-05-27: 1000 mg via ORAL
  Filled 2024-05-27: qty 2

## 2024-05-27 MED ORDER — LACTATED RINGERS IV BOLUS: 250.0000 mL | Freq: Once | INTRAVENOUS | Status: DC

## 2024-05-27 MED ORDER — ACETAMINOPHEN 10 MG/ML IV SOLN: 1000.0000 mg | Freq: Once | INTRAVENOUS | Status: DC | PRN

## 2024-05-27 MED ORDER — MIDAZOLAM HCL 5 MG/5ML IJ SOLN
INTRAMUSCULAR | Status: DC | PRN
  Administered 2024-05-27: 2 mg via INTRAVENOUS

## 2024-05-27 MED ORDER — SODIUM CHLORIDE (PF) 0.9 % IJ SOLN
INTRAMUSCULAR | Status: AC
  Filled 2024-05-27: qty 30

## 2024-05-27 MED ORDER — OXYCODONE HCL 5 MG PO TABS
5.0000 mg | ORAL_TABLET | Freq: Once | ORAL | Status: AC | PRN
  Administered 2024-05-27: 5 mg via ORAL

## 2024-05-27 MED ORDER — FENTANYL CITRATE (PF) 50 MCG/ML IJ SOSY
25.0000 ug | PREFILLED_SYRINGE | INTRAMUSCULAR | Status: DC | PRN
  Administered 2024-05-27 (×3): 50 ug via INTRAVENOUS

## 2024-05-27 MED ORDER — BUPIVACAINE LIPOSOME 1.3 % IJ SUSP
INTRAMUSCULAR | Status: AC
  Filled 2024-05-27: qty 20

## 2024-05-27 MED ORDER — MIDAZOLAM HCL 2 MG/2ML IJ SOLN
INTRAMUSCULAR | Status: AC
  Filled 2024-05-27: qty 2

## 2024-05-27 MED ORDER — DEXAMETHASONE SOD PHOSPHATE PF 10 MG/ML IJ SOLN
INTRAMUSCULAR | Status: DC | PRN
  Administered 2024-05-27: 8 mg via INTRAVENOUS

## 2024-05-27 MED ORDER — OXYCODONE HCL 5 MG PO TABS
ORAL_TABLET | ORAL | Status: AC
  Filled 2024-05-27: qty 1

## 2024-05-27 MED ORDER — METOCLOPRAMIDE HCL 5 MG/ML IJ SOLN: 5.0000 mg | Freq: Three times a day (TID) | INTRAMUSCULAR | Status: DC | PRN

## 2024-05-27 MED ORDER — METOCLOPRAMIDE HCL 5 MG PO TABS: 5.0000 mg | ORAL_TABLET | Freq: Three times a day (TID) | ORAL | Status: DC | PRN

## 2024-05-27 MED ORDER — 0.9 % SODIUM CHLORIDE (POUR BTL) OPTIME
TOPICAL | Status: DC | PRN
  Administered 2024-05-27: 1000 mL

## 2024-05-27 MED ORDER — HYDROMORPHONE HCL 1 MG/ML IJ SOLN
0.2500 mg | INTRAMUSCULAR | Status: DC | PRN
  Administered 2024-05-27 (×4): 0.5 mg via INTRAVENOUS

## 2024-05-27 MED ORDER — WATER FOR IRRIGATION, STERILE IR SOLN
Status: DC | PRN
  Administered 2024-05-27: 2000 mL

## 2024-05-27 MED ORDER — CEFAZOLIN SODIUM-DEXTROSE 2-4 GM/100ML-% IV SOLN
2.0000 g | INTRAVENOUS | Status: AC
  Administered 2024-05-27: 2 g via INTRAVENOUS
  Filled 2024-05-27: qty 100

## 2024-05-27 MED ORDER — PROPOFOL 10 MG/ML IV BOLUS
INTRAVENOUS | Status: AC
  Filled 2024-05-27: qty 20

## 2024-05-27 NOTE — Interval H&P Note (Signed)
 History and Physical Interval Note:  05/27/2024 7:09 AM  Daniel Villanueva  has presented today for surgery, with the diagnosis of OA RIGHT KNEE.  The various methods of treatment have been discussed with the patient and family. After consideration of risks, benefits and other options for treatment, the patient has consented to  Procedures: ARTHROPLASTY, KNEE, TOTAL (Right) as a surgical intervention.  The patient's history has been reviewed, patient examined, no change in status, stable for surgery.  I have reviewed the patient's chart and labs.  Questions were answered to the patient's satisfaction.     Evalene JONETTA Chancy

## 2024-05-27 NOTE — Anesthesia Procedure Notes (Signed)
 Spinal  Patient location during procedure: OR Start time: 05/27/2024 7:30 AM End time: 05/27/2024 7:33 AM Reason for block: surgical anesthesia  Staffing Performed: anesthesiologist  Authorized by: Keneth Lynwood POUR, MD   Performed by: Keneth Lynwood POUR, MD  Preanesthetic Checklist Completed: patient identified, IV checked, site marked, risks and benefits discussed, surgical consent, monitors and equipment checked, pre-op evaluation and timeout performed Spinal Block Patient position: sitting Prep: DuraPrep Patient monitoring: heart rate, cardiac monitor, continuous pulse ox and blood pressure Approach: midline Location: L3-4 Injection technique: single-shot Needle Needle type: Sprotte  Needle gauge: 24 G Needle length: 9 cm Assessment Sensory level: T4 Events: CSF return

## 2024-05-27 NOTE — Progress Notes (Signed)
 Orthopedic Tech Progress Note Patient Details:  Daniel Villanueva [birth year] [id number]  Ortho Devices Type of Ortho Device: Bone foam zero knee Ortho Device/Splint Location: right Ortho Device/Splint Interventions: Ordered, Application, Adjustment   Post Interventions Patient Tolerated: Well Instructions Provided: Adjustment of device, Care of device  Waylan Thom Loving 05/27/2024, 12:20 PM

## 2024-05-27 NOTE — Progress Notes (Signed)
 " Physical Therapy Treatment Patient Details Name: Daniel Villanueva CODE MRN: [id number] DOB: 16-Jun-1960 Today's Date: 05/27/2024   History of Present Illness 64 yo male presents to therapy s/p R TKA on 05/27/2024 due to failure of conservative measures. Pt PMH includes but is not limited to: aortic aneurysm, arthritis, GERD, CAD, HLD, hernia, HTN, lumbar spondylolisthesis s/p back surgery, neck surgery, and R knee surgery.    PT Comments  Second session.  [name] is a 64 y.o. male POD 0 s/p R TKA. Patient reports IND with mobility at baseline. Patient is now limited by functional impairments (see PT problem list below) and requires CGA and cues for transfers and gait with RW. Patient was able to ambulate 45 feet x 2  with RW and CGA and cues for safe walker management. Patient educated on safe sequencing for functional mobility tasks, proper R LE positioning with use of bone foam or towel roll under ankle to facilitate R knee extension, pain management, use of CP/ice, bone foam and CPM, and car transfers pt and spouse verbalized understanding of safe guarding position for people assisting with mobility. Patient instructed in exercises to facilitate ROM and circulation reviewed and HO provided. Patient will benefit from continued skilled PT interventions to address impairments and progress towards PLOF. Patient has met mobility goals at adequate level for discharge home with family support and OPPT services; will continue to follow if pt continues acute stay to progress towards Mod I goals. PT arrived later in pm, pt seated in recliner with R LE flexed and in dependent position and no CP-- pt and spouse ed provided on importance of elevating R LE, facilitating R Knee extension and use of CP/ice to address pain with both parties demonstrating verbal understanding. Pt indicated that his amb sensation numbness was a little better mostly buttock and groin area and unable to void bladder at time of tx session.      If plan is discharge home, recommend the following: A little help with walking and/or transfers;A little help with bathing/dressing/bathroom;Assistance with cooking/housework;Assist for transportation   Can travel by private vehicle        Equipment Recommendations  None recommended by PT    Recommendations for Other Services       Precautions / Restrictions Precautions Precautions: Fall;Knee Restrictions Weight Bearing Restrictions Per Provider Order: No     Mobility  Bed Mobility Overal bed mobility: Needs Assistance Bed Mobility: Supine to Sit     Supine to sit: Contact guard, HOB elevated     General bed mobility comments: pt seated in recliner when PT arrived and returned to relciner at end of session    Transfers Overall transfer level: Needs assistance Equipment used: Rolling walker (2 wheels) Transfers: Sit to/from Stand Sit to Stand: Contact guard assist           General transfer comment: min cues pt able to push to stand from recliner    Ambulation/Gait Ambulation/Gait assistance: Contact guard assist Gait Distance (Feet): 45 Feet Assistive device: Rolling walker (2 wheels) Gait Pattern/deviations: Step-to pattern, Decreased stance time - right, Antalgic, Trunk flexed, Shuffle Gait velocity: decreased     General Gait Details: B UE support at RW to offload R LE in stance phase with slight trunk flexion, pt required cues for RW management including maintining RW on floor, posture, proper distance from RW and sequencing no overt LOB or R knee instabiltiy noted   Stairs  Wheelchair Mobility     Tilt Bed    Modified Rankin (Stroke Patients Only)       Balance Overall balance assessment: Needs assistance Sitting-balance support: No upper extremity supported Sitting balance-Leahy Scale: Good     Standing balance support: Bilateral upper extremity supported, During functional activity, Reliant on assistive device for  balance Standing balance-Leahy Scale: Poor                              Communication Communication Communication: No apparent difficulties  Cognition Arousal: Alert Behavior During Therapy: Flat affect   PT - Cognitive impairments: No apparent impairments                         Following commands: Intact      Cueing    Exercises Total Joint Exercises Ankle Circles/Pumps: AROM, Both, 15 reps Quad Sets: AROM, Right, 5 reps Short Arc Quad: AROM, Right, 5 reps Heel Slides: AROM, Right, 5 reps Hip ABduction/ADduction: AROM, Right, 5 reps Straight Leg Raises: AROM, Right, 5 reps Knee Flexion: AROM, Right, 5 reps    General Comments        Pertinent Vitals/Pain Pain Assessment Pain Assessment: 0-10 Pain Score: 5  Pain Location: R LE and knee Pain Descriptors / Indicators: Aching, Constant, Grimacing, Operative site guarding Pain Intervention(s): Limited activity within patient's tolerance, Monitored during session, Premedicated before session, Repositioned, Ice applied    Home Living                          Prior Function            PT Goals (current goals can now be found in the care plan section) Acute Rehab PT Goals Patient Stated Goal: to be able to get back to playing sports again PT Goal Formulation: With patient Time For Goal Achievement: 06/10/24 Potential to Achieve Goals: Fair Progress towards PT goals: Progressing toward goals    Frequency    7X/week      PT Plan      Co-evaluation              AM-PAC PT 6 Clicks Mobility   Outcome Measure  Help needed turning from your back to your side while in a flat bed without using bedrails?: None Help needed moving from lying on your back to sitting on the side of a flat bed without using bedrails?: A Little Help needed moving to and from a bed to a chair (including a wheelchair)?: A Little Help needed standing up from a chair using your arms (e.g.,  wheelchair or bedside chair)?: A Little Help needed to walk in hospital room?: A Little Help needed climbing 3-5 steps with a railing? : Total 6 Click Score: 17    End of Session Equipment Utilized During Treatment: Gait belt Activity Tolerance: Patient limited by pain Patient left: in chair;with call bell/phone within reach;with family/visitor present Nurse Communication: Mobility status;Other (comment) (Pt readiness for same day d/c from PT standpoint) PT Visit Diagnosis: Unsteadiness on feet (R26.81);Other abnormalities of gait and mobility (R26.89);Muscle weakness (generalized) (M62.81);Difficulty in walking, not elsewhere classified (R26.2);Pain Pain - Right/Left: Right Pain - part of body: Knee;Leg     Time: 8471-8445 PT Time Calculation (min) (ACUTE ONLY): 26 min  Charges:    $Gait Training: 8-22 mins $Therapeutic Exercise: 8-22 mins PT General Charges $$ ACUTE PT VISIT: 1  Visit                     [place], PT Acute Rehab    [place] VEAR Drone 05/27/2024, 4:03 PM   "

## 2024-05-27 NOTE — Anesthesia Procedure Notes (Signed)
 Anesthesia Regional Block: Adductor canal block   Pre-Anesthetic Checklist: , timeout performed,  Correct Patient, Correct Site, Correct Laterality,  Correct Procedure, Correct Position, site marked,  Risks and benefits discussed,  Surgical consent,  Pre-op evaluation,  At surgeon's request and post-op pain management  Laterality: Right  Prep: Maximum Sterile Barrier Precautions used, chloraprep       Needles:  Injection technique: Single-shot  Needle Type: Echogenic Needle      Needle Gauge: 20     Additional Needles:   Procedures:,,,, ultrasound used (permanent image in chart),,    Narrative:  Start time: 05/27/2024 7:10 AM End time: 05/27/2024 7:15 AM Injection made incrementally with aspirations every 5 mL.  Performed by: Personally  Anesthesiologist: Keneth Lynwood POUR, MD

## 2024-05-27 NOTE — Evaluation (Signed)
 "  Physical Therapy Evaluation Patient Details Name: Daniel Villanueva MRN: [id number] DOB: [birth year] Today's Date: 05/27/2024  History of Present Illness  64 yo male presents to therapy s/p R TKA on 05/27/2024 due to failure of conservative measures. Pt PMH includes but is not limited to: aortic aneurysm, arthritis, GERD, CAD, HLD, hernia, HTN, lumbar spondylolisthesis s/p back surgery, neck surgery, and R knee surgery.  Clinical Impression  KINCADE GRANBERG is a 64 y.o. male POD 0 s/p R TKA. Patient reports IND with mobility at baseline. Patient is now limited by functional impairments (see PT problem list below) and requires CGA for bed mobility and CGA for transfers. Patient was able to ambulate 15 feet with RW and CGA level of assist. Patient instructed in exercise to facilitate ROM and circulation to manage edema.Patient will benefit from continued skilled PT interventions to address impairments and progress towards PLOF. Acute PT will follow to progress mobility and stair training in preparation for safe discharge home with family support and OPPT services. Pt limited today during PT eval for same day d/c due to elevated pain response, reported feeling shaky and slow regression of anesthesia with pt reporting R LE abn sensation and numbness extending to low back region and groin. PT to return later in the day to re-assess pt readiness and safety for same day d/c .        If plan is discharge home, recommend the following: A little help with walking and/or transfers;A little help with bathing/dressing/bathroom;Assistance with cooking/housework;Assist for transportation   Can travel by private vehicle        Equipment Recommendations None recommended by PT  Recommendations for Other Services       Functional Status Assessment Patient has had a recent decline in their functional status and demonstrates the ability to make significant improvements in function in a reasonable and predictable amount  of time.     Precautions / Restrictions Precautions Precautions: Fall;Knee Restrictions Weight Bearing Restrictions Per Provider Order: No      Mobility  Bed Mobility Overal bed mobility: Needs Assistance Bed Mobility: Supine to Sit     Supine to sit: Contact guard, HOB elevated     General bed mobility comments: min cues    Transfers Overall transfer level: Needs assistance Equipment used: Rolling walker (2 wheels) Transfers: Sit to/from Stand Sit to Stand: Contact guard assist           General transfer comment: min cues    Ambulation/Gait Ambulation/Gait assistance: Contact guard assist Gait Distance (Feet): 15 Feet Assistive device: Rolling walker (2 wheels) Gait Pattern/deviations: Step-to pattern, Decreased stance time - right, Antalgic, Trunk flexed, Shuffle Gait velocity: decreased     General Gait Details: B UE support at RW to offload R LE in stance phase with slight trunk flexion, pt required cues for RW management, posture, proper distance from RW and sequencing  Stairs            Wheelchair Mobility     Tilt Bed    Modified Rankin (Stroke Patients Only)       Balance Overall balance assessment: Needs assistance Sitting-balance support: No upper extremity supported Sitting balance-Leahy Scale: Good     Standing balance support: Bilateral upper extremity supported, During functional activity, Reliant on assistive device for balance Standing balance-Leahy Scale: Poor  Pertinent Vitals/Pain Pain Assessment Pain Assessment: 0-10 Pain Score: 6  Pain Location: R LE and knee Pain Descriptors / Indicators: Aching, Constant, Grimacing, Operative site guarding Pain Intervention(s): Limited activity within patient's tolerance, Monitored during session, Premedicated before session, Repositioned, Ice applied    Home Living Family/patient expects to be discharged to:: Private residence Living  Arrangements: Spouse/significant other Available Help at Discharge: Family Type of Home: House Home Access: Ramped entrance       Home Layout: One level Home Equipment: [employer] (2 wheels)      Prior Function Prior Level of Function : Driving;Independent/Modified Independent             Mobility Comments: IND no AD for all ADLs self care tasks and IADLs       Extremity/Trunk Assessment        Lower Extremity Assessment Lower Extremity Assessment: RLE deficits/detail RLE Deficits / Details: ankle DF/PF 5/5; SLR < 10 degree lag RLE Sensation: decreased light touch (pt reports numb/abn sensation from R proximal posterior LE to low back)    Cervical / Trunk Assessment Cervical / Trunk Assessment: Back Surgery;Neck Surgery  Communication   Communication Communication: No apparent difficulties    Cognition Arousal: Alert Behavior During Therapy: Flat affect   PT - Cognitive impairments: No apparent impairments                         Following commands: Intact       Cueing       General Comments      Exercises Total Joint Exercises Ankle Circles/Pumps: AROM, Both, 15 reps Quad Sets: AROM, Right, 5 reps Short Arc Quad: AROM, Right, 5 reps Heel Slides: AROM, Right, 5 reps Hip ABduction/ADduction: AROM, Right, 5 reps Straight Leg Raises: AROM, Right, 5 reps   Assessment/Plan    PT Assessment Patient needs continued PT services  PT Problem List Decreased strength;Decreased range of motion;Decreased balance;Decreased activity tolerance;Decreased mobility;Decreased coordination;Pain       PT Treatment Interventions DME instruction;Gait training;Functional mobility training;Therapeutic activities;Therapeutic exercise;Balance training;Neuromuscular re-education;Patient/family education;Modalities    PT Goals (Current goals can be found in the Care Plan section)  Acute Rehab PT Goals Patient Stated Goal: to be able to get back to playing  sports again PT Goal Formulation: With patient Time For Goal Achievement: 06/10/24 Potential to Achieve Goals: Fair    Frequency 7X/week     Co-evaluation               AM-PAC PT 6 Clicks Mobility  Outcome Measure Help needed turning from your back to your side while in a flat bed without using bedrails?: None Help needed moving from lying on your back to sitting on the side of a flat bed without using bedrails?: A Little Help needed moving to and from a bed to a chair (including a wheelchair)?: A Little Help needed standing up from a chair using your arms (e.g., wheelchair or bedside chair)?: A Little Help needed to walk in hospital room?: A Little Help needed climbing 3-5 steps with a railing? : Total 6 Click Score: 17    End of Session Equipment Utilized During Treatment: Gait belt Activity Tolerance: Patient limited by pain;Patient limited by fatigue;Other (comment) (reprots feeling shaky and numb) Patient left: in chair;with call bell/phone within reach;with family/visitor present Nurse Communication: Mobility status PT Visit Diagnosis: Unsteadiness on feet (R26.81);Other abnormalities of gait and mobility (R26.89);Muscle weakness (generalized) (M62.81);Difficulty in walking, not elsewhere classified (R26.2);Pain Pain -  Right/Left: Right Pain - part of body: Knee;Leg    Time: 8774-8695 PT Time Calculation (min) (ACUTE ONLY): 39 min   Charges:   PT Evaluation $PT Eval Low Complexity: 1 Low PT Treatments $Gait Training: 8-22 mins $Therapeutic Exercise: 8-22 mins PT General Charges $$ ACUTE PT VISIT: 1 Visit         [place], PT Acute Rehab   [place] VEAR Drone 05/27/2024, 1:20 PM "

## 2024-05-27 NOTE — Interval H&P Note (Signed)
 History and Physical Interval Note:  05/27/2024 7:08 AM  Daniel Villanueva  has presented today for surgery, with the diagnosis of OA RIGHT KNEE.  The various methods of treatment have been discussed with the patient and family. After consideration of risks, benefits and other options for treatment, the patient has consented to  Procedures: ARTHROPLASTY, KNEE, TOTAL (Right) as a surgical intervention.  The patient's history has been reviewed, patient examined, no change in status, stable for surgery.  I have reviewed the patient's chart and labs.  Questions were answered to the patient's satisfaction.     Daniel Villanueva

## 2024-05-27 NOTE — Anesthesia Postprocedure Evaluation (Signed)
"   Anesthesia Post Note  Patient: Daniel Villanueva  Procedure(s) Performed: ARTHROPLASTY, KNEE, TOTAL (Right: Knee)     Patient location during evaluation: PACU Anesthesia Type: MAC and Spinal Level of consciousness: awake and alert Pain management: pain level controlled Vital Signs Assessment: post-procedure vital signs reviewed and stable Respiratory status: spontaneous breathing, nonlabored ventilation, respiratory function stable and patient connected to nasal cannula oxygen Cardiovascular status: blood pressure returned to baseline and stable Postop Assessment: no apparent nausea or vomiting Anesthetic complications: no   No notable events documented.  Last Vitals:  Vitals:   05/27/24 1130 05/27/24 1145  BP: 138/82 127/78  Pulse: 69 69  Resp: 16   Temp:    SpO2: 98% 99%    Last Pain:  Vitals:   05/27/24 1145  TempSrc:   PainSc: 5                  [place] MARLA Cornea      "

## 2024-05-27 NOTE — Transfer of Care (Signed)
 Immediate Anesthesia Transfer of Care Note  Patient: Daniel Villanueva  Procedure(s) Performed: ARTHROPLASTY, KNEE, TOTAL (Right: Knee)  Patient Location: PACU  Anesthesia Type:MAC combined with regional for post-op pain  Level of Consciousness: awake and alert   Airway & Oxygen Therapy: Patient Spontanous Breathing and Patient connected to nasal cannula oxygen  Post-op Assessment: Report given to RN and Post -op Vital signs reviewed and stable  Post vital signs: Reviewed and stable  Last Vitals:  Vitals Value Taken Time  BP 104/58 05/27/24 09:18  Temp    Pulse 68 05/27/24 09:21  Resp 11 05/27/24 09:21  SpO2 99 % 05/27/24 09:21  Vitals shown include unfiled device data.  Last Pain:  Vitals:   05/27/24 0600  TempSrc: Oral         Complications: No notable events documented.

## 2024-05-27 NOTE — Op Note (Signed)
 DATE OF SURGERY:  05/27/2024 TIME: 8:45 AM  PATIENT NAME:  Daniel Villanueva   AGE: 64 y.o.    PRE-OPERATIVE DIAGNOSIS:  OA RIGHT KNEE  POST-OPERATIVE DIAGNOSIS:  Same  PROCEDURE:  Procedures: ARTHROPLASTY, KNEE, TOTAL   SURGEON:  Evalene JONETTA Chancy, MD   ASSISTANT:  Gerard Large, PA-C, was present and scrubbed throughout the case, critical for completion in a timely fashion, and for retraction, instrumentation, and closure.   OPERATIVE IMPLANTS: [employer] Triathlon Posterior Stabilized.  Femur size 6, Tibia size 7, Patella size 35 3-peg oval button, with a 11 mm polyethylene insert.   PREOPERATIVE INDICATIONS:  Daniel Villanueva is a 64 y.o. year old male with end stage bone on bone degenerative arthritis of the knee who failed conservative treatment, including injections, antiinflammatories, activity modification, and assistive devices, and had significant impairment of their activities of daily living, and elected for Total Knee Arthroplasty.   The risks, benefits, and alternatives were discussed at length including but not limited to the risks of infection, bleeding, nerve injury, stiffness, blood clots, the need for revision surgery, cardiopulmonary complications, among others, and they were willing to proceed.   OPERATIVE DESCRIPTION:  The patient was brought to the operative room and placed in a supine position.  General anesthesia was administered.  IV antibiotics were given.  The lower extremity was prepped and draped in the usual sterile fashion.  Time out was performed.  The leg was elevated and exsanguinated and the tourniquet was inflated.  Anterior approach was performed.  The patella was everted and osteophytes were removed.  The anterior horn of the medial and lateral meniscus was removed.   The distal femur was opened with the drill and the intramedullary distal femoral cutting jig was utilized, set at 5 degrees resecting 10 mm off the distal femur.  Care was taken to protect  the collateral ligaments.  The distal femoral sizing jig was applied, taking care to avoid notching.  Then the 4-in-1 cutting jig was applied and the anterior and posterior femur was cut, along with the chamfer cuts.  All posterior osteophytes were removed.  The flexion gap was then measured and was symmetric with the extension gap.  Then the extramedullary tibial cutting jig was utilized making the appropriate cut using the anterior tibial crest as a reference building in appropriate posterior slope.  Care was taken during the cut to protect the medial and collateral ligaments.  The proximal tibia was removed along with the posterior horns of the menisci.  The PCL was sacrificed.    The extensor gap was measured and was approximately 9mm.    I completed the distal femoral preparation using the appropriate jig to prepare the box.  The patella was then measured, and cut with the saw.    The proximal tibia sized and prepared accordingly with the reamer and the punch, and then all components were trialed with the 11mm poly insert.  The knee was found to have excellent balance and full motion.    The above named components were then cemented into place and all excess cement was removed. Poly tibial piece and patella were inserted.  I was very happy with his stability and ROM  I performed a periarticular injection with marcaine  and toradol   The knee was easily taken through a range of motion and the patella tracked well and the knee irrigated copiously and the parapatellar and subcutaneous tissue closed with vicryl, and monocryl with steri strips for the skin.  The  incision was dressed with sterile gauze and the tourniquet released and the patient was awakened and returned to the PACU in stable and satisfactory condition.  There were no complications.  Total tourniquet time was 75 minutes.   POSTOPERATIVE PLAN: post op Abx, DVT px: SCD's, TED's, Early ambulation and chemical px

## 2024-05-28 ENCOUNTER — Encounter (HOSPITAL_COMMUNITY): Payer: Self-pay | Admitting: Orthopedic Surgery

## 2024-08-08 ENCOUNTER — Other Ambulatory Visit (HOSPITAL_COMMUNITY)
